# Patient Record
Sex: Male | Born: 1955 | Race: White | Hispanic: No | Marital: Married | Smoking: Former smoker
Health system: Southern US, Community
[De-identification: ages and names within clinical notes are randomized; demographics above are authoritative.]

## PROBLEM LIST (undated history)

## (undated) DIAGNOSIS — I10 Essential (primary) hypertension: Secondary | ICD-10-CM

## (undated) DIAGNOSIS — J45909 Unspecified asthma, uncomplicated: Secondary | ICD-10-CM

## (undated) DIAGNOSIS — U071 COVID-19: Secondary | ICD-10-CM

## (undated) HISTORY — PX: TOOTH EXTRACTION: SUR596

---

## 2011-08-22 ENCOUNTER — Encounter (HOSPITAL_BASED_OUTPATIENT_CLINIC_OR_DEPARTMENT_OTHER): Payer: Self-pay | Admitting: *Deleted

## 2011-08-22 ENCOUNTER — Emergency Department (HOSPITAL_BASED_OUTPATIENT_CLINIC_OR_DEPARTMENT_OTHER)
Admission: EM | Admit: 2011-08-22 | Discharge: 2011-08-22 | Disposition: A | Discharge status: Home / Self Care | Payer: 59 | Attending: Emergency Medicine | Admitting: Emergency Medicine

## 2011-08-22 DIAGNOSIS — I4510 Unspecified right bundle-branch block: Secondary | ICD-10-CM | POA: Insufficient documentation

## 2011-08-22 DIAGNOSIS — J45909 Unspecified asthma, uncomplicated: Secondary | ICD-10-CM | POA: Insufficient documentation

## 2011-08-22 DIAGNOSIS — I10 Essential (primary) hypertension: Secondary | ICD-10-CM | POA: Insufficient documentation

## 2011-08-22 DIAGNOSIS — H81399 Other peripheral vertigo, unspecified ear: Secondary | ICD-10-CM

## 2011-08-22 DIAGNOSIS — Z91038 Other insect allergy status: Secondary | ICD-10-CM | POA: Insufficient documentation

## 2011-08-22 HISTORY — DX: Essential (primary) hypertension: I10

## 2011-08-22 HISTORY — DX: Unspecified asthma, uncomplicated: J45.909

## 2011-08-22 MED ORDER — MECLIZINE HCL 25 MG PO TABS
25.0000 mg | ORAL_TABLET | Freq: Once | ORAL | Status: AC
  Administered 2011-08-22: 25 mg via ORAL
  Filled 2011-08-22: qty 1

## 2011-08-22 MED ORDER — MECLIZINE HCL 25 MG PO TABS: 25.0000 mg | ORAL_TABLET | Freq: Four times a day (QID) | ORAL | Status: AC | PRN

## 2011-08-22 MED ORDER — METOCLOPRAMIDE HCL 5 MG/ML IJ SOLN
10.0000 mg | Freq: Once | INTRAMUSCULAR | Status: AC
  Administered 2011-08-22: 10 mg via INTRAMUSCULAR
  Filled 2011-08-22: qty 2

## 2011-08-22 MED ORDER — DIAZEPAM 5 MG PO TABS: 5.0000 mg | ORAL_TABLET | Freq: Four times a day (QID) | ORAL | Status: AC | PRN

## 2011-08-22 NOTE — ED Notes (Signed)
Pt states he has had "vertigo-like" s/s x 1 week. N/V Thursday. None since. "Just feel weak."

## 2011-08-22 NOTE — ED Provider Notes (Signed)
History    Scribed for Hurman Horn, MD, the patient was seen in room MH07/MH07. This chart was scribed by Katha Cabal.   CSN: [id number]  Arrival date & time 08/22/11  1932   First MD Initiated Contact with Patient 08/22/11 2006      Chief Complaint  Patient presents with  . Dizziness    (Consider location/radiation/quality/duration/timing/severity/associated sxs/prior treatment) HPI Hurman Horn, MD entered patient's room at 8:07 PM   Leonard Hall is a 56 y.o. male who presents to the Emergency Department complaining of gradual worsening of dizziness with associated episodes of vomiting.  Patient reports having episode of nausea and vomiting about a week ago.  Positional vertigo.   Dizziness described as vertigo worse with movement and better while remaining still.  Denies visual disturbances, severe headache, cough, fever, bites, rashes, numbness, incoordination and confusion.  Symptoms are associated with generalized weakness. Patient with history of hypertension and is compliant with medications.  No recent flights or history of vertigo.  Patient has been driving in [place] in the mountains.  No change speech/vision/swallow/understanding. No lateralizing weakness or ataxia.       Past Medical History  Diagnosis Date  . Hypertension   . Asthma     Past Surgical History  Procedure Date  . Tooth extraction     History reviewed. No pertinent family history.  History  Substance Use Topics  . Smoking status: Never Smoker   . Smokeless tobacco: Not on file  . Alcohol Use: No      Review of Systems 10 Systems reviewed and all are negative for acute change except as noted in the HPI.   Allergies  Bee venom  Home Medications   Current Outpatient Rx  Name Route Sig Dispense Refill  . LISINOPRIL 20 MG PO TABS Oral Take 20 mg by mouth daily.    Marland Kitchen DIAZEPAM 5 MG PO TABS Oral Take 1 tablet (5 mg total) by mouth every 6 (six) hours as needed (vertigo).  10 tablet 0  . MECLIZINE HCL 25 MG PO TABS Oral Take 1 tablet (25 mg total) by mouth every 6 (six) hours as needed for dizziness. 20 tablet 0    BP 157/86  Pulse 68  Temp 98 F (36.7 C) (Oral)  Resp 20  Ht 5\' 10"  (1.778 m)  Wt 215 lb (97.523 kg)  BMI 30.85 kg/m2  SpO2 99%  Physical Exam  Nursing note and vitals reviewed. Constitutional:       Awake, alert, nontoxic appearance with baseline speech for patient.  HENT:  Head: Atraumatic.  Right Ear: Tympanic membrane normal.  Left Ear: Tympanic membrane normal.  Mouth/Throat: No oropharyngeal exudate.  Eyes: EOM are normal. Pupils are equal, round, and reactive to light. Right eye exhibits no discharge. Left eye exhibits no discharge.       lateral Nystagmus unidirectional fast component to the left with no disconjugate gaze   Neck: Neck supple.  Cardiovascular: Normal rate and regular rhythm.   No murmur heard. Pulmonary/Chest: Effort normal and breath sounds normal. No stridor. No respiratory distress. He has no wheezes. He has no rales. He exhibits no tenderness.  Abdominal: Soft. Bowel sounds are normal. He exhibits no mass. There is no tenderness. There is no rebound.  Musculoskeletal: He exhibits no tenderness.       Baseline ROM, moves extremities with no obvious new focal weakness.  Lymphadenopathy:    He has no cervical adenopathy.  Neurological:  Awake, alert, cooperative and aware of situation; motor strength 5/5 bilaterally; sensation normal to light touch bilaterally; peripheral visual fields full to confrontation; no facial asymmetry; tongue midline; major cranial nerves appear intact; no pronator drift, normal finger to nose bilaterally, baseline gait without new ataxia.  Skin: No rash noted.  Psychiatric: He has a normal mood and affect.    ED Course  Procedures (including critical care time) ECG: Normal sinus rhythm, ventricular rate 68, normal axis, incomplete right bundle branch block, no acute ischemic  changes noted, no comparison ECG available   DIAGNOSTIC STUDIES: Oxygen Saturation is 99% on room air, normal by my interpretation.     COORDINATION OF CARE: 8:17 PM  Physical exam complete.  Patient was drove to ED by his brother. 8:30 PM  Reglan and Antivert ordered.   9:51 PM  Patient informed of clinical course, understand medical decision-making process, and agree with plan.  Pt feels improved after observation and/or treatment in ED.  Patient is ambulatory without disturbances.        LABS / RADIOLOGY:   Labs Reviewed - No data to display No results found.       MDM  Doubt SAH, SBI, CVA.           MEDICATIONS GIVEN IN THE E.D. Scheduled Meds:    . meclizine  25 mg Oral Once  . metoCLOPramide (REGLAN) injection  10 mg Intramuscular Once   Continuous Infusions:      IMPRESSION: 1. Vertigo, peripheral      NEW MEDICATIONS: New Prescriptions   DIAZEPAM (VALIUM) 5 MG TABLET    Take 1 tablet (5 mg total) by mouth every 6 (six) hours as needed (vertigo).   MECLIZINE (ANTIVERT) 25 MG TABLET    Take 1 tablet (25 mg total) by mouth every 6 (six) hours as needed for dizziness.     I personally performed the services described in this documentation, which was scribed in my presence. The recorded information has been reviewed and considered.      Hurman Horn, MD 08/23/11 [phone]

## 2012-04-24 ENCOUNTER — Encounter (HOSPITAL_BASED_OUTPATIENT_CLINIC_OR_DEPARTMENT_OTHER): Payer: Self-pay

## 2012-04-24 ENCOUNTER — Inpatient Hospital Stay (HOSPITAL_BASED_OUTPATIENT_CLINIC_OR_DEPARTMENT_OTHER)
Admission: EM | Admit: 2012-04-24 | Discharge: 2012-04-26 | DRG: 190 | Disposition: A | Discharge status: Home / Self Care | Payer: 59 | Attending: Internal Medicine | Admitting: Internal Medicine

## 2012-04-24 ENCOUNTER — Emergency Department (HOSPITAL_BASED_OUTPATIENT_CLINIC_OR_DEPARTMENT_OTHER): Payer: 59

## 2012-04-24 DIAGNOSIS — J45902 Unspecified asthma with status asthmaticus: Secondary | ICD-10-CM

## 2012-04-24 DIAGNOSIS — Z87891 Personal history of nicotine dependence: Secondary | ICD-10-CM

## 2012-04-24 DIAGNOSIS — J189 Pneumonia, unspecified organism: Secondary | ICD-10-CM | POA: Diagnosis present

## 2012-04-24 DIAGNOSIS — J45901 Unspecified asthma with (acute) exacerbation: Secondary | ICD-10-CM | POA: Diagnosis present

## 2012-04-24 DIAGNOSIS — J441 Chronic obstructive pulmonary disease with (acute) exacerbation: Principal | ICD-10-CM | POA: Diagnosis present

## 2012-04-24 DIAGNOSIS — C44319 Basal cell carcinoma of skin of other parts of face: Secondary | ICD-10-CM | POA: Diagnosis present

## 2012-04-24 DIAGNOSIS — J9600 Acute respiratory failure, unspecified whether with hypoxia or hypercapnia: Secondary | ICD-10-CM

## 2012-04-24 DIAGNOSIS — Z883 Allergy status to other anti-infective agents status: Secondary | ICD-10-CM

## 2012-04-24 DIAGNOSIS — Z881 Allergy status to other antibiotic agents status: Secondary | ICD-10-CM

## 2012-04-24 DIAGNOSIS — Z79899 Other long term (current) drug therapy: Secondary | ICD-10-CM

## 2012-04-24 DIAGNOSIS — J9601 Acute respiratory failure with hypoxia: Secondary | ICD-10-CM | POA: Diagnosis present

## 2012-04-24 DIAGNOSIS — Z91038 Other insect allergy status: Secondary | ICD-10-CM

## 2012-04-24 DIAGNOSIS — I10 Essential (primary) hypertension: Secondary | ICD-10-CM | POA: Diagnosis present

## 2012-04-24 DIAGNOSIS — Z91018 Allergy to other foods: Secondary | ICD-10-CM

## 2012-04-24 LAB — CBC
HCT: 40.6 % (ref 39.0–52.0)
Hemoglobin: 14.9 g/dL (ref 13.0–17.0)
MCH: 31.5 pg (ref 26.0–34.0)
MCHC: 36.7 g/dL — ABNORMAL HIGH (ref 30.0–36.0)
MCV: 85.8 fL (ref 78.0–100.0)
Platelets: 227 10*3/uL (ref 150–400)
RBC: 4.73 MIL/uL (ref 4.22–5.81)
RDW: 12.1 % (ref 11.5–15.5)
WBC: 8.3 10*3/uL (ref 4.0–10.5)

## 2012-04-24 LAB — COMPREHENSIVE METABOLIC PANEL
ALT: 13 U/L (ref 0–53)
AST: 18 U/L (ref 0–37)
Albumin: 4.2 g/dL (ref 3.5–5.2)
Alkaline Phosphatase: 45 U/L (ref 39–117)
Calcium: 10.3 mg/dL (ref 8.4–10.5)
Potassium: 5.4 mEq/L — ABNORMAL HIGH (ref 3.5–5.1)
Sodium: 142 mEq/L (ref 135–145)
Total Protein: 7 g/dL (ref 6.0–8.3)

## 2012-04-24 LAB — CREATININE, SERUM
Creatinine, Ser: 0.88 mg/dL (ref 0.50–1.35)
GFR calc Af Amer: >90 mL/min
GFR calc non Af Amer: >90 mL/min

## 2012-04-24 LAB — CBC WITH DIFFERENTIAL/PLATELET
Basophils Absolute: 0 10*3/uL (ref 0.0–0.1)
Basophils Relative: 0 % (ref 0–1)
Eosinophils Absolute: 0.2 10*3/uL (ref 0.0–0.7)
Eosinophils Relative: 4 % (ref 0–5)
HCT: 44.7 % (ref 39.0–52.0)
Hemoglobin: 16.1 g/dL (ref 13.0–17.0)
Lymphocytes Relative: 25 % (ref 12–46)
MCV: 89.4 fL (ref 78.0–100.0)
Neutro Abs: 3.6 10*3/uL (ref 1.7–7.7)
WBC: 6.3 10*3/uL (ref 4.0–10.5)

## 2012-04-24 MED ORDER — ALBUTEROL SULFATE (5 MG/ML) 0.5% IN NEBU
INHALATION_SOLUTION | RESPIRATORY_TRACT | Status: AC
  Administered 2012-04-24: 15 mg/h
  Filled 2012-04-24: qty 0.5

## 2012-04-24 MED ORDER — LISINOPRIL 20 MG PO TABS
20.0000 mg | ORAL_TABLET | Freq: Every day | ORAL | Status: DC
Start: 2012-04-25 — End: 2012-04-26
  Administered 2012-04-25 – 2012-04-26 (×2): 20 mg via ORAL
  Filled 2012-04-24 (×2): qty 1

## 2012-04-24 MED ORDER — SODIUM CHLORIDE 0.9 % IJ SOLN: 3.0000 mL | INTRAMUSCULAR | Status: DC | PRN

## 2012-04-24 MED ORDER — AZITHROMYCIN 500 MG IV SOLR
500.0000 mg | INTRAVENOUS | Status: DC
  Administered 2012-04-25: 500 mg via INTRAVENOUS
  Filled 2012-04-24 (×2): qty 500

## 2012-04-24 MED ORDER — PNEUMOCOCCAL VAC POLYVALENT 25 MCG/0.5ML IJ INJ
0.5000 mL | INJECTION | INTRAMUSCULAR | Status: DC
  Filled 2012-04-24: qty 0.5

## 2012-04-24 MED ORDER — ENOXAPARIN SODIUM 40 MG/0.4ML ~~LOC~~ SOLN
40.0000 mg | SUBCUTANEOUS | Status: DC
  Administered 2012-04-24 – 2012-04-25 (×2): 40 mg via SUBCUTANEOUS
  Filled 2012-04-24 (×3): qty 0.4

## 2012-04-24 MED ORDER — DIPHENHYDRAMINE HCL 50 MG/ML IJ SOLN
50.0000 mg | Freq: Once | INTRAMUSCULAR | Status: AC
  Administered 2012-04-24: 50 mg via INTRAVENOUS

## 2012-04-24 MED ORDER — SODIUM CHLORIDE 0.9 % IJ SOLN
3.0000 mL | Freq: Two times a day (BID) | INTRAMUSCULAR | Status: DC
  Administered 2012-04-25 (×2): 3 mL via INTRAVENOUS

## 2012-04-24 MED ORDER — LEVOFLOXACIN IN D5W 750 MG/150ML IV SOLN
750.0000 mg | Freq: Once | INTRAVENOUS | Status: DC
  Filled 2012-04-24: qty 150

## 2012-04-24 MED ORDER — ALBUTEROL SULFATE (5 MG/ML) 0.5% IN NEBU
5.0000 mg | INHALATION_SOLUTION | Freq: Once | RESPIRATORY_TRACT | Status: AC
  Administered 2012-04-24: 5 mg via RESPIRATORY_TRACT

## 2012-04-24 MED ORDER — IPRATROPIUM BROMIDE 0.02 % IN SOLN
RESPIRATORY_TRACT | Status: AC
  Administered 2012-04-24: 0.5 mg via RESPIRATORY_TRACT
  Filled 2012-04-24: qty 2.5

## 2012-04-24 MED ORDER — CEFTRIAXONE SODIUM 1 G IJ SOLR
1.0000 g | INTRAMUSCULAR | Status: DC
  Administered 2012-04-24: 1 g via INTRAVENOUS
  Filled 2012-04-24 (×2): qty 10

## 2012-04-24 MED ORDER — ALBUTEROL SULFATE (5 MG/ML) 0.5% IN NEBU: 2.5000 mg | INHALATION_SOLUTION | RESPIRATORY_TRACT | Status: DC | PRN

## 2012-04-24 MED ORDER — MAGNESIUM SULFATE 50 % IJ SOLN
1.0000 g | Freq: Once | INTRAMUSCULAR | Status: AC
  Administered 2012-04-24: 1 g via INTRAVENOUS
  Filled 2012-04-24: qty 2

## 2012-04-24 MED ORDER — DIPHENHYDRAMINE HCL 50 MG/ML IJ SOLN
INTRAMUSCULAR | Status: AC
  Administered 2012-04-24: 50 mg via INTRAVENOUS
  Filled 2012-04-24: qty 1

## 2012-04-24 MED ORDER — IPRATROPIUM BROMIDE 0.02 % IN SOLN
0.5000 mg | Freq: Once | RESPIRATORY_TRACT | Status: AC
  Administered 2012-04-24: 0.5 mg via RESPIRATORY_TRACT

## 2012-04-24 MED ORDER — PREDNISONE 20 MG PO TABS
20.0000 mg | ORAL_TABLET | Freq: Two times a day (BID) | ORAL | Status: DC
  Administered 2012-04-24 – 2012-04-25 (×2): 20 mg via ORAL
  Filled 2012-04-24 (×5): qty 1

## 2012-04-24 MED ORDER — ALBUTEROL SULFATE (5 MG/ML) 0.5% IN NEBU
INHALATION_SOLUTION | RESPIRATORY_TRACT | Status: AC
  Administered 2012-04-24: 5 mg via RESPIRATORY_TRACT
  Filled 2012-04-24: qty 1

## 2012-04-24 MED ORDER — SODIUM CHLORIDE 0.45 % IV SOLN: INTRAVENOUS | Status: DC

## 2012-04-24 MED ORDER — SODIUM CHLORIDE 0.9 % IV SOLN: 250.0000 mL | INTRAVENOUS | Status: DC | PRN

## 2012-04-24 MED ORDER — PREDNISONE 50 MG PO TABS
60.0000 mg | ORAL_TABLET | Freq: Once | ORAL | Status: AC
  Administered 2012-04-24: 60 mg via ORAL
  Filled 2012-04-24: qty 1

## 2012-04-24 MED ORDER — ALBUTEROL SULFATE (5 MG/ML) 0.5% IN NEBU
5.0000 mg | INHALATION_SOLUTION | RESPIRATORY_TRACT | Status: DC
  Administered 2012-04-24: 5 mg via RESPIRATORY_TRACT
  Filled 2012-04-24: qty 1

## 2012-04-24 MED ORDER — LEVOFLOXACIN IN D5W 750 MG/150ML IV SOLN
750.0000 mg | Freq: Once | INTRAVENOUS | Status: AC
  Administered 2012-04-24: 750 mg via INTRAVENOUS
  Filled 2012-04-24: qty 150

## 2012-04-24 NOTE — ED Provider Notes (Addendum)
History     CSN: [id number]  Arrival date & time 04/24/12  1348   First MD Initiated Contact with Patient 04/24/12 1425      Chief Complaint  Patient presents with  . Asthma    (Consider location/radiation/quality/duration/timing/severity/associated sxs/prior treatment) Patient is a 57 y.o. male presenting with asthma. The history is provided by the patient.  Asthma This is a recurrent problem. The current episode started more than 1 week ago. The problem occurs constantly. The problem has been gradually worsening. Associated symptoms include shortness of breath. Pertinent negatives include no chest pain and no abdominal pain. The symptoms are aggravated by exertion and coughing. The symptoms are relieved by relaxation (albuterol but out of).   Patient with history of asthma,copd with uri symptoms began 1.5 weeks ago states began coughing about one week ago with brownish sputum.  Patient presents due to increasing dyspnea.  Patient had nebulizer here prior to my evaluation.  Patient has had albuterol inhaler and when wheezing or sick uses up to multiple times a day.  Patient has been out of inhaler 2-3 days.  Patient has been on multiple other meds and tried advair and symbicort but did not tolerate in past.  He has been eating and drinking without difficulty.  Patient called ems last night but refused transport after treatment.  Patient took wife's augmentin 8-10 over a week.  Past Medical History  Diagnosis Date  . Hypertension   . Asthma     Past Surgical History  Procedure Laterality Date  . Tooth extraction      History reviewed. No pertinent family history.  History  Substance Use Topics  . Smoking status: Never Smoker   . Smokeless tobacco: Not on file  . 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    Review of Systems  Constitutional: Negative for fever, activity change and appetite change.  HENT: Positive for rhinorrhea. Negative  for sinus pressure.   Eyes: Negative.   Respiratory: Positive for cough, chest tightness and shortness of breath.   Cardiovascular: Negative for chest pain and leg swelling.  Gastrointestinal: Negative.  Negative for abdominal pain.  Endocrine: Negative.   Genitourinary: Negative.   Musculoskeletal: Negative.   Skin: Negative.   Allergic/Immunologic: Negative.   Hematological: Negative.   Psychiatric/Behavioral: Negative.     Allergies  Bee venom and Cashew nut oil  Home Medications   Current Outpatient Rx  Name  Route  Sig  Dispense  Refill  . albuterol (PROVENTIL HFA;VENTOLIN HFA) 108 (90 BASE) MCG/ACT inhaler   Inhalation   Inhale 2 puffs into the lungs every 4 (four) hours as needed for wheezing.         Marland Kitchen albuterol (PROVENTIL) (5 MG/ML) 0.5% nebulizer solution   Nebulization   Take 5 mg by nebulization every 6 (six) hours as needed for wheezing.         Marland Kitchen lisinopril (PRINIVIL,ZESTRIL) 20 MG tablet   Oral   Take 20 mg by mouth daily.           BP 146/83  Pulse 75  Temp(Src) 97.7 F (36.5 C) (Oral)  Resp 30  Ht 5\' 10"  (1.778 m)  Wt 220 lb (99.791 kg)  BMI 31.57 kg/m2  SpO2 100%  Physical Exam  Nursing note and vitals reviewed. Constitutional: He is oriented to person, place, and time. He appears well-developed and well-nourished.  HENT:  Head: Normocephalic and atraumatic.  Right Ear: External ear normal.  Left Ear: External ear normal.  Nose: Nose normal.  Mouth/Throat: Oropharynx is clear and moist.  Eyes: Conjunctivae and EOM are normal. Pupils are equal, round, and reactive to light.  Neck: Normal range of motion. Neck supple.  Cardiovascular: Normal rate, regular rhythm, normal heart sounds and intact distal pulses.   Pulmonary/Chest: No respiratory distress. He has wheezes. He exhibits no tenderness.  Breath sounds decreased with tight wheezing  Abdominal: Soft. Bowel sounds are normal. He exhibits no distension and no mass. There is no  tenderness. There is no guarding.  Musculoskeletal: Normal range of motion.  Neurological: He is alert and oriented to person, place, and time. He has normal reflexes. He exhibits normal muscle tone. Coordination normal.  Skin: Skin is warm and dry.  Psychiatric: He has a normal mood and affect. His behavior is normal. Judgment and thought content normal.    ED Course  Procedures (including critical care time)  Labs Reviewed  CBC WITH DIFFERENTIAL - Abnormal; Notable for the following:    Monocytes Relative 14 (*)    All other components within normal limits  COMPREHENSIVE METABOLIC PANEL - Abnormal; Notable for the following:    Potassium 5.4 (*)    Glucose, Bld 124 (*)    GFR calc non Af Amer 82 (*)    All other components within normal limits   No results found.  Date: 04/24/2012  Rate: 68  Rhythm: normal sinus rhythm  QRS Axis: normal  Intervals: normal  ST/T Wave abnormalities: normal  Conduction Disutrbances: none  Narrative Interpretation: unremarkable  Dg Chest 2 View  04/24/2012  *RADIOLOGY REPORT*  Clinical Data:  Asthma, short of breath  CHEST - 2 VIEW  Comparison: None.  Findings: Patchy, somewhat angular opacity in the lingula.  The lungs are slightly hyperinflated and there is central airway thickening/peribronchial cuffing.  The cardiac and mediastinal contours within normal limits.  No focal airspace consolidation, pleural effusion or pneumothorax.  No acute osseous abnormality.  IMPRESSION:  Angular opacity in the lingula favored to reflect atelectasis. Early infiltrate/pneumonia difficult to exclude in the appropriate clinical setting.  Pulmonary hyperexpansion and central airway thickening consistent with clinical history of asthma.   Original Report Authenticated By: Malachy Moan, M.D.       No diagnosis found. CRITICAL CARE Performed by: Hilario Quarry   Total critical care time: 45  Critical care time was exclusive of separately billable procedures  and treating other patients.  Critical care was necessary to treat or prevent imminent or life-threatening deterioration.  Critical care was time spent personally by me on the following activities: development of treatment plan with patient and/or surrogate as well as nursing, discussions with consultants, evaluation of patient's response to treatment, examination of patient, obtaining history from patient or surrogate, ordering and performing treatments and interventions, ordering and review of laboratory studies, ordering and review of radiographic studies, pulse oximetry and re-evaluation of patient's condition.  MDM  Patient given nebulizer, prednisone 60 mg, followed by hour long neb, prednisone 60 mg, and magnesium 2 grams iv.  Patient reevaluated multiple times and continues to have expiratory wheezes although improved air flow.  Patient continues to require oxygen via [place] and is tachypneic although awake and alert and speaking on phrases.  Patient with partially treated vs untreated vs resolving vs failed cap after self treating with augmentin 875 for 8-10 doses over a week of borrowed medication.  Plan transfer to [facility] via [facility].  Patient discussed with Dr. Benjamine Mola and will be sent  to step down bed.          Hilario Quarry, MD 04/24/12 1734  Hilario Quarry, MD 04/24/12 1743

## 2012-04-24 NOTE — H&P (Signed)
Chief Complaint:  sob  HPI: 57 yo male h/o htn and asthma since childhood with over one week of sob, wheezing.  Ran out of his alb at home.  Did take several days of his wifes augmentin about a week ago.  No fevers at home.  Pos cough nonprod.  Nonsmoker.  No n/v/d.  Went to [facility] transferred here for further eval, was tachypneic and wheezing with some hypoxia there, this is majorly improved with tx of alb, and predisone and levaquin there.  Pt developed a rash after levaquin given at [facility] center.  His resp status is back to his baseline now.  No cp.  No le edema or swelling.  Review of Systems:  Positive and negative as per HPI otherwise all other systems are negative  Past Medical History: Past Medical History  Diagnosis Date  . Hypertension   . Asthma    Past Surgical History  Procedure Laterality Date  . Tooth extraction      Medications: Prior to Admission medications   Medication Sig Start Date End Date Taking? Authorizing Provider  albuterol (PROVENTIL HFA;VENTOLIN HFA) 108 (90 BASE) MCG/ACT inhaler Inhale 2 puffs into the lungs every 4 (four) hours as needed for wheezing.   Yes Historical Provider, MD  albuterol (PROVENTIL) (5 MG/ML) 0.5% nebulizer solution Take 5 mg by nebulization every 6 (six) hours as needed for wheezing.   Yes Historical Provider, MD  lisinopril (PRINIVIL,ZESTRIL) 20 MG tablet Take 20 mg by mouth daily.   Yes Historical Provider, MD    Allergies:   Allergies  Allergen Reactions  . Bee Venom Anaphylaxis  . Cashew Nut Oil   . Levaquin (Levofloxacin In D5w) Rash    Social History:  reports that he has quit smoking. He does not have any smokeless tobacco history on file. He reports that he does not drink alcohol or use illicit drugs.  Family History: History reviewed. No pertinent family history.  Physical Exam: Filed Vitals:   04/24/12 1730 04/24/12 1755 04/24/12 1802 04/24/12 2103  BP:   133/73 141/79  Pulse: 71  71 69  Temp:    98.3 F  (36.8 C)  TempSrc:    Oral  Resp: 16  21 12   Height:    5\' 10"  (1.778 m)  Weight:    101.7 kg (224 lb 3.3 oz)  SpO2: 96% 100% 100% 97%   General appearance: alert, cooperative, appears stated age and no distress Head: Normocephalic, without obvious abnormality, atraumatic Eyes: negative Nose: Nares normal. Septum midline. Mucosa normal. No drainage or sinus tenderness. Neck: no JVD and supple, symmetrical, trachea midline Lungs: clear to auscultation bilaterally Heart: regular rate and rhythm, S1, S2 normal, no murmur, click, rub or gallop Abdomen: soft, non-tender; bowel sounds normal; no masses,  no organomegaly Extremities: extremities normal, atraumatic, no cyanosis or edema Pulses: 2+ and symmetric Skin: Skin color, texture, turgor normal. No rashes or lesions Neurologic: Grossly normal    Labs on Admission:   Recent Labs  04/24/12 1410 04/24/12 1615  NA 142  --   K 5.4* 3.5  CL 106  --   CO2 25  --   GLUCOSE 124*  --   BUN 15  --   CREATININE 1.00  --   CALCIUM 10.3  --     Recent Labs  04/24/12 1410  AST 18  ALT 13  ALKPHOS 45  BILITOT 0.8  PROT 7.0  ALBUMIN 4.2    Recent Labs  04/24/12 1410  WBC 6.3  NEUTROABS 3.6  HGB 16.1  HCT 44.7  MCV 89.4  PLT 249    Radiological Exams on Admission: Dg Chest 2 View  04/24/2012  *RADIOLOGY REPORT*  Clinical Data:  Asthma, short of breath  CHEST - 2 VIEW  Comparison: None.  Findings: Patchy, somewhat angular opacity in the lingula.  The lungs are slightly hyperinflated and there is central airway thickening/peribronchial cuffing.  The cardiac and mediastinal contours within normal limits.  No focal airspace consolidation, pleural effusion or pneumothorax.  No acute osseous abnormality.  IMPRESSION:  Angular opacity in the lingula favored to reflect atelectasis. Early infiltrate/pneumonia difficult to exclude in the appropriate clinical setting.  Pulmonary hyperexpansion and central airway thickening consistent  with clinical history of asthma.   Original Report Authenticated By: Malachy Moan, M.D.     Assessment/Plan  57 yo male with acute asthma exac with CAP  Principal Problem:   Asthma with acute exacerbation Active Problems:   PNA (pneumonia)   HTN (hypertension)   Acute respiratory failure with hypoxia  Place on iv rocephin and azithro.  Po prednisone.  Oxygen prn.  freq nebs.  Monitor in stepdown.  Full code.    Dorell Gatlin A 04/24/2012, 9:23 PM

## 2012-04-24 NOTE — Progress Notes (Signed)
Pt. Admitted to room 3309 from [facility]; vss; patient has no complaints of SOB at this time; lungs dim. Throughout.  Callight within reach; pt. Oriented to room; will continue to monitor.  Pt. Had rash develop at [facility] due to the administration of Levaquin; allergy added to chart.   Vivi Martens RN

## 2012-04-24 NOTE — ED Notes (Signed)
Wife states that pt has been sick all weekend and called EMS and was given breathing tx last night, improved, elected to have no futher eval/treatment.  Pt with acute exacerbation today, Purfuse wheezing, diminished in bases.

## 2012-04-24 NOTE — ED Notes (Signed)
MD at bedside. 

## 2012-04-24 NOTE — Progress Notes (Signed)
Transfer from [facility] for asthma exacerbation.  Hx of smoking URI symptoms x 1 week -ran out of albuterol- took some of wife's augmentin ?PNA on x ray  No PCP  Given: neb, abx, steroids, O2- 2L Improved with above  SDU per ER doc  Marlin Canary DO

## 2012-04-24 NOTE — ED Notes (Signed)
After initial breathing treatment pt was placed on O2 at 2lpm, L upper lung sounds diminished, LLL and RUL, RML, RLL all inspiratory and expiratory wheezing remains

## 2012-04-25 LAB — CBC WITH DIFFERENTIAL/PLATELET
Basophils Relative: 0 % (ref 0–1)
Eosinophils Relative: 1 % (ref 0–5)
HCT: 40.2 % (ref 39.0–52.0)
Hemoglobin: 15 g/dL (ref 13.0–17.0)
MCHC: 37.3 g/dL — ABNORMAL HIGH (ref 30.0–36.0)
MCV: 87.2 fL (ref 78.0–100.0)
Monocytes Absolute: 0.5 10*3/uL (ref 0.1–1.0)
Monocytes Relative: 6 % (ref 3–12)
Neutro Abs: 8.3 10*3/uL — ABNORMAL HIGH (ref 1.7–7.7)

## 2012-04-25 LAB — BASIC METABOLIC PANEL
CO2: 24 mEq/L (ref 19–32)
Calcium: 9.5 mg/dL (ref 8.4–10.5)
Creatinine, Ser: 0.95 mg/dL (ref 0.50–1.35)
GFR calc Af Amer: >90 mL/min (ref >90)

## 2012-04-25 LAB — LEGIONELLA ANTIGEN, URINE: Legionella Antigen, Urine: NEGATIVE

## 2012-04-25 LAB — STREP PNEUMONIAE URINARY ANTIGEN: Strep Pneumo Urinary Antigen: NEGATIVE

## 2012-04-25 MED ORDER — AZITHROMYCIN 250 MG PO TABS
250.0000 mg | ORAL_TABLET | Freq: Every day | ORAL | Status: DC
  Administered 2012-04-25 – 2012-04-26 (×2): 250 mg via ORAL
  Filled 2012-04-25 (×2): qty 1

## 2012-04-25 MED ORDER — CEFUROXIME AXETIL 500 MG PO TABS
500.0000 mg | ORAL_TABLET | Freq: Two times a day (BID) | ORAL | Status: DC
  Administered 2012-04-25 – 2012-04-26 (×2): 500 mg via ORAL
  Filled 2012-04-25 (×4): qty 1

## 2012-04-25 MED ORDER — METHYLPREDNISOLONE SODIUM SUCC 125 MG IJ SOLR
60.0000 mg | Freq: Three times a day (TID) | INTRAMUSCULAR | Status: DC
  Administered 2012-04-25 – 2012-04-26 (×3): 60 mg via INTRAVENOUS
  Filled 2012-04-25 (×5): qty 0.96
  Filled 2012-04-25: qty 2
  Filled 2012-04-25: qty 0.96

## 2012-04-25 MED ORDER — LORATADINE 10 MG PO TABS
10.0000 mg | ORAL_TABLET | Freq: Every day | ORAL | Status: DC
  Administered 2012-04-25 – 2012-04-26 (×2): 10 mg via ORAL
  Filled 2012-04-25 (×2): qty 1

## 2012-04-25 MED ORDER — ALBUTEROL SULFATE (5 MG/ML) 0.5% IN NEBU
2.5000 mg | INHALATION_SOLUTION | Freq: Four times a day (QID) | RESPIRATORY_TRACT | Status: DC
  Administered 2012-04-25 – 2012-04-26 (×3): 2.5 mg via RESPIRATORY_TRACT
  Filled 2012-04-25 (×3): qty 0.5

## 2012-04-25 MED ORDER — PNEUMOCOCCAL VAC POLYVALENT 25 MCG/0.5ML IJ INJ
0.5000 mL | INJECTION | INTRAMUSCULAR | Status: AC | PRN
  Administered 2012-04-26: 0.5 mL via INTRAMUSCULAR
  Filled 2012-04-25: qty 0.5

## 2012-04-25 NOTE — Progress Notes (Signed)
Report called to Misty Stanley, receiving RN on 5500.  Pt transferred to 5527 via wheelchair with all belongings. Pt's wife accompanied pt to new room.   Roselie Awkward, RN

## 2012-04-25 NOTE — Progress Notes (Signed)
Utilization review completed.  

## 2012-04-25 NOTE — Progress Notes (Signed)
[facility] Progress Note [place] TEAM 1 - Stepdown/ICU TEAM   DRAYCE TAWIL ZOX:[id number] DOB: 1955-09-04 DOA: 04/24/2012 PCP: No primary provider on file.  Brief narrative: 57 yo male h/o htn and asthma since childhood with over one week of sob, wheezing. Ran out of his alb at home. Did take several days of his wifes augmentin about a week ago. No fevers at home. Pos cough nonprod. Nonsmoker. No n/v/d. Went to [facility] transferred here for further eval, was tachypneic and wheezing with some hypoxia there, this is majorly improved with tx of alb, and predisone and levaquin there. Pt developed a rash after levaquin given at [facility] center. His resp status is back to his baseline now. No cp. No le edema or swelling.  Assessment/Plan:  Asthma with acute exacerbation The patient is improving overall but has suffered somewhat of a setback this afternoon - I. feel it would be unwise to discharge him at this time - we will continue aggressive measures - we will initiate peak flow monitoring - he would benefit from scheduled inhaled steroids once his acute exacerbation has resolved and he is weaned from systemic steroids - I. 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 cell carcinoma right cheek I have discussed the appearance of the patient's mole with he and his wife and his daughter - I have advised that excision would be most appropriate in my opinion - I have noted 2 dermatologist and 2 plastic surgeons in the [place] area - the patient and his wife voiced understanding and the  wife reports that she will schedule a followup as soon as possible with the physician of their choice   Code Status: Full Family Communication: Spoke with patient wife and daughter at bedside Disposition Plan: Transfer to telemetry bed  Consultants: None  Procedures: None  Antibiotics: Rocephin 4/20 Azithromycin 4/20  DVT prophylaxis: Lovenox  HPI/Subjective: The patient was feeling much better early this morning.  As the day has progressed he has begun to experience recurrence of his wheezing.  He denies chest pain nausea vomiting fevers or chills.  Objective: Blood pressure 122/81, pulse 67, temperature 97.9 F (36.6 C), temperature source Oral, resp. rate 11, height 5\' 10"  (1.778 m), weight 101.7 kg (224 lb 3.3 oz), SpO2 94.00%.  Intake/Output Summary (Last 24 hours) at 04/25/12 1429 Last data filed at 04/25/12 0600  Gross per 24 hour  Intake    300 ml  Output   1275 ml  Net   -975 ml   Exam: General: No acute respiratory distress Lungs: Diffuse end expiratory wheeze with no focal crackles with good air movement throughout otherwise Cardiovascular: Regular rate and rhythm without murmur gallop or rub normal S1 and S2 Abdomen: Nontender, nondistended, soft, bowel sounds positive, no rebound, no ascites, no appreciable mass Extremities: No significant cyanosis, clubbing, or edema bilateral lower extremities Cutaneous:  The patient has an approximate 4 mm diameter irregular mole of the right cheek with a very small central cavitation that appears possibly consistent with a basal cell carcinoma  Data Reviewed: Basic Metabolic Panel:  Recent Labs Lab 04/24/12 1410 04/24/12  1615 04/24/12 2138 04/25/12 0435  NA 142  --   --  135  K 5.4* 3.5  --  5.3*  CL 106  --   --  101  CO2 25  --   --  24  GLUCOSE 124*  --   --  135*  BUN 15  --   --  15  CREATININE 1.00  --  0.88 0.95  CALCIUM 10.3  --   --  9.5   Liver Function Tests:  Recent Labs Lab 04/24/12 1410   AST 18  ALT 13  ALKPHOS 45  BILITOT 0.8  PROT 7.0  ALBUMIN 4.2   CBC:  Recent Labs Lab 04/24/12 1410 04/24/12 2138 04/25/12 0435  WBC 6.3 8.3 9.4  NEUTROABS 3.6  --  8.3*  HGB 16.1 14.9 15.0  HCT 44.7 40.6 40.2  MCV 89.4 85.8 87.2  PLT 249 227 235    Recent Results (from the past 240 hour(s))  MRSA PCR SCREENING     Status: None   Collection Time    04/24/12 10:04 PM      Result Value Range Status   MRSA by PCR NEGATIVE  NEGATIVE Final   Comment:            The GeneXpert MRSA Assay (FDA     approved for NASAL specimens     only), is one component of a     comprehensive MRSA colonization     surveillance program. It is not     intended to diagnose MRSA     infection nor to guide or     monitor treatment for     MRSA infections.     Studies:  Recent x-ray studies have been reviewed in detail by the Attending Physician  Scheduled Meds:  Scheduled Meds: . azithromycin  500 mg Intravenous Q24H  . cefTRIAXone (ROCEPHIN)  IV  1 g Intravenous Q24H  . enoxaparin (LOVENOX) injection  40 mg Subcutaneous Q24H  . lisinopril  20 mg Oral Daily  . predniSONE  20 mg Oral BID WC  . sodium chloride  3 mL Intravenous Q12H   Continuous Infusions:   Time spent on care of this patient:   [facility] T  [facility] Office  [phone] Pager - Text Page per Loretha Stapler as per below:  On-Call/Text Page:      [email or web address]      password [email or web address]  If 7PM-7AM, please contact night-coverage [email or web address] Password [email or web address] 04/25/2012, 2:29 PM   LOS: 1 day

## 2012-04-25 NOTE — Progress Notes (Signed)
NURSING PROGRESS NOTE  Leonard Hall [id number] Transfer Data: 04/25/2012 4:55 PM Attending Provider: Lonia Blood, MD PCP:No primary provider on file. Code Status: full  Leonard Hall is a 57 y.o. male patient transferred from 41 -No acute distress noted.  -No complaints of shortness of breath.  -No complaints of chest pain.   Cardiac Monitoring: Box # 5527 in place. Cardiac monitor yields:normal sinus rhythm.  Blood pressure 123/67, pulse 66, temperature 97.8 F (36.6 C), temperature source Oral, resp. rate 20, height 5\' 10"  (1.778 m), weight 101.7 kg (224 lb 3.3 oz), SpO2 91.00%.   IV Fluids:  IV in place, occlusive dsg intact without redness, IV cath antecubital left, condition patent and no redness.  Allergies:  Bee venom; Cashew nut oil; and Levaquin  Past Medical History:   has a past medical history of Hypertension and Asthma.  Past Surgical History:   has past surgical history that includes Tooth extraction.  Social History:   reports that he has quit smoking. He does not have any smokeless tobacco history on file. He reports that he does not drink alcohol or use illicit drugs.  Skin: warm dry intact  Patient/Family orientated to room. Information packet given to patient/family. Admission inpatient armband information verified with patient/family to include name and date of birth and placed on patient arm. Side rails up x 2, fall assessment and education completed with patient/family. Patient/family able to verbalize understanding of risk associated with falls and verbalized understanding to call for assistance before getting out of bed. Call light within reach. Patient/family able to voice and demonstrate understanding of unit orientation instructions.    Will continue to evaluate and treat per MD orders.

## 2012-04-26 DIAGNOSIS — J45901 Unspecified asthma with (acute) exacerbation: Secondary | ICD-10-CM

## 2012-04-26 LAB — BASIC METABOLIC PANEL
BUN: 16 mg/dL (ref 6–23)
Chloride: 101 mEq/L (ref 96–112)
GFR calc non Af Amer: >90 mL/min (ref >90)
Glucose, Bld: 151 mg/dL — ABNORMAL HIGH (ref 70–99)
Potassium: 4.6 mEq/L (ref 3.5–5.1)

## 2012-04-26 MED ORDER — CEFUROXIME AXETIL 500 MG PO TABS: 500.0000 mg | ORAL_TABLET | Freq: Two times a day (BID) | ORAL | Status: DC

## 2012-04-26 MED ORDER — MONTELUKAST SODIUM 10 MG PO TABS: 10.0000 mg | ORAL_TABLET | Freq: Every day | ORAL | Status: DC

## 2012-04-26 MED ORDER — ALBUTEROL SULFATE HFA 108 (90 BASE) MCG/ACT IN AERS: 2.0000 | INHALATION_SPRAY | RESPIRATORY_TRACT | Status: DC | PRN

## 2012-04-26 MED ORDER — AZITHROMYCIN 250 MG PO TABS: ORAL_TABLET | ORAL | Status: DC

## 2012-04-26 MED ORDER — PREDNISONE (PAK) 10 MG PO TABS: 50.0000 mg | ORAL_TABLET | Freq: Every day | ORAL | Status: DC

## 2012-04-26 MED ORDER — ALBUTEROL SULFATE (5 MG/ML) 0.5% IN NEBU: 5.0000 mg | INHALATION_SOLUTION | Freq: Four times a day (QID) | RESPIRATORY_TRACT | Status: DC | PRN

## 2012-04-26 NOTE — Progress Notes (Signed)
NURSING PROGRESS NOTE  Leonard Hall [id number] Discharge Data: 04/26/2012 12:05 PM Attending Provider: Clydia Llano, MD PCP:No primary provider on file.     Leonard Hall to be D/C'd Home per MD order.  Discussed with the patient the After Visit Summary and all questions fully answered. All IV's discontinued with no bleeding noted. All belongings returned to patient for patient to take home.   Last Vital Signs:  Blood pressure 106/63, pulse 57, temperature 98.1 F (36.7 C), temperature source Oral, resp. rate 20, height 5\' 10"  (1.778 m), weight 101.7 kg (224 lb 3.3 oz), SpO2 91.00%.  Discharge Medication List   Medication List    TAKE these medications       albuterol 108 (90 BASE) MCG/ACT inhaler  Commonly known as:  PROVENTIL HFA;VENTOLIN HFA  Inhale 2 puffs into the lungs every 4 (four) hours as needed for wheezing.     albuterol (5 MG/ML) 0.5% nebulizer solution  Commonly known as:  PROVENTIL  Take 1 mL (5 mg total) by nebulization every 6 (six) hours as needed for wheezing.     azithromycin 250 MG tablet  Commonly known as:  ZITHROMAX  Take one pill daily until gone  Start taking on:  04/27/2012     cefUROXime 500 MG tablet  Commonly known as:  CEFTIN  Take 1 tablet (500 mg total) by mouth 2 (two) times daily with a meal.     lisinopril 20 MG tablet  Commonly known as:  PRINIVIL,ZESTRIL  Take 20 mg by mouth daily.     montelukast 10 MG tablet  Commonly known as:  SINGULAIR  Take 1 tablet (10 mg total) by mouth at bedtime.     predniSONE 10 MG tablet  Commonly known as:  STERAPRED UNI-PAK  Take 5 tablets (50 mg total) by mouth daily. Take 5 pills with breakfast on 4/23, 4/24  Take 4 pills with breakfast on 4/25, 4/26  Take 3 pills with breakfast on 4/27, 428  Take 2 pills with breakfast on 4/29, 4/30  Take 1 pill with breakfast on 5/1 and 5/2.  Start taking on:  04/27/2012

## 2012-04-26 NOTE — Discharge Summary (Signed)
Physician Discharge Summary  Leonard Hall WJX:[id number] DOB: April 14, 1955 DOA: 04/24/2012  PCP: Vivien Presto, MD  Admit date: 04/24/2012 Discharge date: 04/26/2012  Time spent: 45 minutes  Recommendations for Outpatient Follow-up:  Establish care with a primary care physician for long term care of your asthma and medical maintenance issues.  Discharge Diagnoses:  Principal Problem:   Asthma with acute exacerbation Active Problems:   PNA (pneumonia)   HTN (hypertension)   Acute respiratory failure with hypoxia   Discharge Condition: stable, still with mild wheeze and SOB  Diet recommendation: Heart healthy  Filed Weights   04/24/12 1359 04/24/12 2103  Weight: 99.791 kg (220 lb) 101.7 kg (224 lb 3.3 oz)    History of present illness:  57 yo male h/o htn and asthma since childhood with over one week of sob, wheezing. Ran out of his albuterol at home. Did take several days of his wife's augmentin about a week ago. No fevers at home. cough nonprod. Nonsmoker. No n/v/d. Went to [facility] transferred here for further eval, was tachypneic and wheezing with some hypoxia there, this is majorly improved with tx of albuterol, predisone and levaquin. Pt developed a rash after levaquin given at [facility] center. His resp status is back to his baseline now. No cp. No le edema or swelling.   Hospital Course:   Acute asthma exacerbation causing acute respiratory failure with hypoxia. Leonard Hall was treated with Nebulizers, antibiotics and Steroids.  He improved over 48 hours and while he is still wheezing, he is stable enough to recover at home.  He will be discharged with prescriptions for albuterol nebs, albuterol inhaler, singulair, Ceftin and Azithromycin.    Community acquired Pneumonia CXR showed possible Left lingular PNA, but this may be atelectasis.  Prescribed ceftin and azithromycin for a total of 5 days of antibiotic treatment.  Hypertension Controlled on Lisinopril.  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  I have discussed the appearance of the patient's mole with he and his wife and his daughter - I have advised that excision would be most appropriate in my opinion - I have noted 2 dermatologist and 2 plastic surgeons in the [place] area - the patient and his wife voiced understanding and the wife reports that she will schedule a followup as soon as possible with the physician of their choice       Discharge Exam: Filed Vitals:   04/25/12 1958 04/25/12 2125 04/26/12 0501 04/26/12 0828  BP:  132/65 106/63   Pulse:  75 57   Temp:  98 F (36.7 C) 98.1 F (36.7 C)   TempSrc:  Oral Oral   Resp:  20 20   Height:      Weight:      SpO2: 91% 95% 93% 91%   General: wd, wn, male with no acute distress  Lungs: Diffuse end expiratory wheeze, no crackles or rales, no accessory muscle movement.   Cardiovascular: Regular rate and rhythm without murmur gallop or rub normal S1 and S2  Abdomen: Nontender, nondistended, soft, bowel sounds positive, no rebound, no ascites, no appreciable mass  Extremities: No significant cyanosis, clubbing, or edema bilateral lower extremities  Cutaneous: The patient has an approximate 4 mm diameter irregular mole of the right cheek with a very small central cavitation that appears possibly consistent with a basal cell carcinoma    Discharge Instructions      Discharge Orders   Future Orders Complete By Expires     Diet - low sodium heart  healthy  As directed     Increase activity slowly  As directed         Medication List    TAKE these medications       albuterol 108 (90 BASE) MCG/ACT inhaler  Commonly known as:  PROVENTIL HFA;VENTOLIN HFA  Inhale 2 puffs into the lungs every 4 (four) hours as needed for wheezing.     albuterol (5 MG/ML) 0.5% nebulizer solution  Commonly known as:  PROVENTIL  Take 1 mL (5 mg total) by nebulization every 6 (six) hours as needed for wheezing.     azithromycin 250 MG tablet  Commonly  known as:  ZITHROMAX  Take one pill daily until gone  Start taking on:  04/27/2012     cefUROXime 500 MG tablet  Commonly known as:  CEFTIN  Take 1 tablet (500 mg total) by mouth 2 (two) times daily with a meal.     lisinopril 20 MG tablet  Commonly known as:  PRINIVIL,ZESTRIL  Take 20 mg by mouth daily.     montelukast 10 MG tablet  Commonly known as:  SINGULAIR  Take 1 tablet (10 mg total) by mouth at bedtime.     predniSONE 10 MG tablet  Commonly known as:  STERAPRED UNI-PAK  Take 5 tablets (50 mg total) by mouth daily. Take 5 pills with breakfast on 4/23, 4/24  Take 4 pills with breakfast on 4/25, 4/26  Take 3 pills with breakfast on 4/27, 428  Take 2 pills with breakfast on 4/29, 4/30  Take 1 pill with breakfast on 5/1 and 5/2.  Start taking on:  04/27/2012       Follow-up Information   Please follow up. (Please follow up with a primary care physician regarding on-going care of your asthma)        The results of significant diagnostics from this hospitalization (including imaging, microbiology, ancillary and laboratory) are listed below for reference.    Significant Diagnostic Studies: Dg Chest 2 View  04/24/2012  *RADIOLOGY REPORT*  Clinical Data:  Asthma, short of breath  CHEST - 2 VIEW  Comparison: None.  Findings: Patchy, somewhat angular opacity in the lingula.  The lungs are slightly hyperinflated and there is central airway thickening/peribronchial cuffing.  The cardiac and mediastinal contours within normal limits.  No focal airspace consolidation, pleural effusion or pneumothorax.  No acute osseous abnormality.  IMPRESSION:  Angular opacity in the lingula favored to reflect atelectasis. Early infiltrate/pneumonia difficult to exclude in the appropriate clinical setting.  Pulmonary hyperexpansion and central airway thickening consistent with clinical history of asthma.   Original Report Authenticated By: Malachy Moan, M.D.     Microbiology: Recent Results  (from the past 240 hour(s))  CULTURE, BLOOD (ROUTINE X 2)     Status: None   Collection Time    04/24/12  9:45 PM      Result Value Range Status   Specimen Description BLOOD LEFT HAND   Final   Special Requests BOTTLES DRAWN AEROBIC AND ANAEROBIC 10CC EA   Final   Culture  Setup Time 04/25/2012 07:30   Final   Culture     Final   Value:        BLOOD CULTURE RECEIVED NO GROWTH TO DATE CULTURE WILL BE HELD FOR 5 DAYS BEFORE ISSUING A FINAL NEGATIVE REPORT   Report Status PENDING   Incomplete  CULTURE, BLOOD (ROUTINE X 2)     Status: None   Collection Time    04/24/12  9:52 PM  Result Value Range Status   Specimen Description BLOOD RIGHT HAND   Final   Special Requests BOTTLES DRAWN AEROBIC AND ANAEROBIC 10CC EA   Final   Culture  Setup Time 04/25/2012 07:30   Final   Culture     Final   Value:        BLOOD CULTURE RECEIVED NO GROWTH TO DATE CULTURE WILL BE HELD FOR 5 DAYS BEFORE ISSUING A FINAL NEGATIVE REPORT   Report Status PENDING   Incomplete  MRSA PCR SCREENING     Status: None   Collection Time    04/24/12 10:04 PM      Result Value Range Status   MRSA by PCR NEGATIVE  NEGATIVE Final   Comment:            The GeneXpert MRSA Assay (FDA     approved for NASAL specimens     only), is one component of a     comprehensive MRSA colonization     surveillance program. It is not     intended to diagnose MRSA     infection nor to guide or     monitor treatment for     MRSA infections.     Labs: Basic Metabolic Panel:  Recent Labs Lab 04/24/12 1410 04/24/12 1615 04/24/12 2138 04/25/12 0435 04/26/12 0600  NA 142  --   --  135 135  K 5.4* 3.5  --  5.3* 4.6  CL 106  --   --  101 101  CO2 25  --   --  24 23  GLUCOSE 124*  --   --  135* 151*  BUN 15  --   --  15 16  CREATININE 1.00  --  0.88 0.95 0.89  CALCIUM 10.3  --   --  9.5 9.7   Liver Function Tests:  Recent Labs Lab 04/24/12 1410  AST 18  ALT 13  ALKPHOS 45  BILITOT 0.8  PROT 7.0  ALBUMIN 4.2    CBC:  Recent Labs Lab 04/24/12 1410 04/24/12 2138 04/25/12 0435  WBC 6.3 8.3 9.4  NEUTROABS 3.6  --  8.3*  HGB 16.1 14.9 15.0  HCT 44.7 40.6 40.2  MCV 89.4 85.8 87.2  PLT 249 227 235    Signed:  Conley Canal  [facility] 04/26/2012, 1:44 PM

## 2012-04-26 NOTE — Discharge Summary (Signed)
Addendum  Patient seen and examined, chart and data base reviewed.  I agree with the above assessment and discharge plan.  For full details please see Mrs. Algis Downs PA note.  Acute severe asthma, flair result, he is on room air and minimal wheezing, home today.   Clint Lipps, MD [facility] Pager: [phone] 04/26/2012, 3:41 PM

## 2012-05-01 LAB — CULTURE, BLOOD (ROUTINE X 2): Culture: NO GROWTH

## 2012-08-29 ENCOUNTER — Institutional Professional Consult (permissible substitution): Payer: 59 | Admitting: Critical Care Medicine

## 2012-09-09 ENCOUNTER — Institutional Professional Consult (permissible substitution): Payer: 59 | Admitting: Critical Care Medicine

## 2012-09-09 ENCOUNTER — Ambulatory Visit (INDEPENDENT_AMBULATORY_CARE_PROVIDER_SITE_OTHER): Payer: 59 | Admitting: Internal Medicine

## 2012-09-09 ENCOUNTER — Encounter: Payer: Self-pay | Admitting: Internal Medicine

## 2012-09-09 VITALS — BP 134/80 | HR 73 | Temp 97.7°F | Ht 70.0 in | Wt 235.0 lb

## 2012-09-09 DIAGNOSIS — R05 Cough: Secondary | ICD-10-CM

## 2012-09-09 DIAGNOSIS — J45901 Unspecified asthma with (acute) exacerbation: Secondary | ICD-10-CM

## 2012-09-09 DIAGNOSIS — I10 Essential (primary) hypertension: Secondary | ICD-10-CM

## 2012-09-09 MED ORDER — OLMESARTAN MEDOXOMIL 20 MG PO TABS: ORAL_TABLET | ORAL | Status: DC

## 2012-09-09 MED ORDER — PREDNISONE (PAK) 10 MG PO TABS: ORAL_TABLET | ORAL | Status: DC

## 2012-09-09 NOTE — Progress Notes (Signed)
Subjective:    Patient ID: Leonard Hall, male    DOB: [birth year]   MRN: [id number]  HPI  71 yowm sickly as child dx as asthma took shots thru age 57 and improved didn't take daily meds flares sev times a year and not trouble with aerobic sports and then in 30's while smoking quit smoking around age 85 more freq flares and need for advair around 2009 and singulair and symbicort and much worse x 2011 while on lisinopril with most most recent admit to [facility] hospital April 2014 referred by Leonard Hall 09/09/2012 to pulmonary clinic for dtc asthma.      09/09/2012 1st [facility] office visit/ Leonard Hall on ACEi cc daily cough / congestion x 3 years some better since last round of prednisone but still feel need for saba neb up to twice daily, no purulent sputum but severe cough with mopstly mucoid sputum during flares which are more freq since admit as below  Admit date: 04/24/2012  Discharge date: 04/26/2012    Recommendations for Outpatient Follow-up:  Establish care with a primary care physician for long term care of your asthma and medical maintenance issues.  Discharge Diagnoses:  Principal Problem:  Asthma with acute exacerbation  Active Problems:  PNA (pneumonia)  HTN (hypertension)  Acute respiratory failure with hypoxia  Discharge Condition: stable, still with mild wheeze and SOB  Diet recommendation: Heart healthy  Filed Weights    04/24/12 1359  04/24/12 2103   Weight:  99.791 kg (220 lb)  101.7 kg (224 lb 3.3 oz)   History of present illness:  57 yo male h/o htn and asthma since childhood with over one week of sob, wheezing. Ran out of his albuterol at home. Did take several days of his wife's augmentin about a week ago. No fevers at home. cough nonprod. Nonsmoker. No n/v/d. Went to [facility] transferred here for further eval, was tachypneic and wheezing with some hypoxia there, this is majorly improved with tx of albuterol, predisone and levaquin. Pt developed a rash after levaquin given  at [facility] center. His resp status is back to his baseline now. No cp. No le edema or swelling.  Hospital Course:  Acute asthma exacerbation causing acute respiratory failure with hypoxia.  Leonard Hall was treated with Nebulizers, antibiotics and Steroids. He improved over 48 hours and while he is still wheezing, he is stable enough to recover at home. He will be discharged with prescriptions for albuterol nebs, albuterol inhaler, singulair, Ceftin and Azithromycin.  Community acquired Pneumonia  CXR showed possible Left lingular PNA, but this may be atelectasis. Prescribed ceftin and azithromycin for a total of 5 days of antibiotic treatment.  Hypertension  Controlled on Lisinopril.     Wife says wheezing loudly intermittently while sleeping   No obvious daytime variabilty or assoc ex or pleuritic cp  overt sinus or hb symptoms. No unusual exp hx or h/o childhood pna/ asthma or knowledge of premature birth.    Also denies any obvious fluctuation of symptoms with weather or environmental changes or other aggravating or alleviating factors except as outlined above.  Current Medications, Allergies, Past Medical History, Past Surgical History, Family History, and Social History were reviewed in [employer] record.       Review of Systems  Constitutional: Negative for fever, chills, activity change, appetite change and unexpected weight change.  HENT: Negative for congestion, sore throat, rhinorrhea, sneezing, trouble swallowing, dental problem, voice change and postnasal drip.   Eyes: Negative for  visual disturbance.  Respiratory: Positive for cough. Negative for choking and shortness of breath.   Cardiovascular: Negative for chest pain and leg swelling.  Gastrointestinal: Negative for nausea, vomiting and abdominal pain.  Genitourinary: Negative for difficulty urinating.  Musculoskeletal: Positive for arthralgias.  Skin: Negative for rash.  Psychiatric/Behavioral:  Negative for behavioral problems and confusion.       Objective:   Physical Exam  amb mod obese wm nad with classic prominent pseudowheeze heard across the room  Wt Readings from Last 3 Encounters:  09/09/12 235 lb (106.595 kg)  04/24/12 224 lb 3.3 oz (101.7 kg)  08/22/11 215 lb (97.523 kg)     HEENT: nl dentition, turbinates, and orophanx. Nl external ear canals without cough reflex   NECK :  without JVD/Nodes/TM/ nl carotid upstrokes bilaterally   LUNGS: no acc muscle use, clear to A and P bilaterally without cough on insp or exp maneuvers   CV:  RRR  no s3 or murmur or increase in P2, no edema   ABD:  soft and nontender with nl excursion in the supine position. No bruits or organomegaly, bowel sounds nl  MS:  warm without deformities, calf tenderness, cyanosis or clubbing  SKIN: warm and dry without lesions    NEURO:  alert, approp, no deficits    cxr 04/24/12  Angular opacity in the lingula favored to reflect atelectasis.  Early infiltrate/pneumonia difficult to exclude in the appropriate  clinical setting.  Pulmonary hyperexpansion and central airway thickening consistent  with clinical history of asthma.       Assessment & Plan:

## 2012-09-09 NOTE — Patient Instructions (Addendum)
Stop lisinopril - it may be fueling your problems and takes a few weeks to wear off Start benicar 20 mg one daily  Only nebulizer if you need it for breathing or cough mucinex dm is good for the cough   GERD (REFLUX)  is an extremely common cause of respiratory symptoms just like yours and combines with  Lisinopril to destablize your airway , many times with no significant heartburn at all.    It can be treated with medication, but also with lifestyle changes including avoidance of late meals, excessive alcohol, smoking cessation, and avoid fatty foods, chocolate, peppermint, colas, red wine, and acidic juices such as orange juice.  NO MINT OR MENTHOL PRODUCTS SO NO COUGH DROPS  USE SUGARLESS CANDY INSTEAD (jolley ranchers or Stover's)  NO OIL BASED VITAMINS - use powdered substitutes.  If not better: Prednisone 10 mg take  4 each am x 2 days,   2 each am x 2 days,  1 each am x 2 days and stop Pantoprazole (protonix) 40 mg   Take 30-60 min before first meal of the day and Pepcid 20 mg one bedtime until return to office  Please schedule a follow up office visit in 4 weeks, sooner if needed pfts / cxr

## 2012-09-10 DIAGNOSIS — R05 Cough: Secondary | ICD-10-CM | POA: Insufficient documentation

## 2012-09-10 NOTE — Assessment & Plan Note (Signed)
Classic Upper airway cough syndrome, so named because it's frequently impossible to sort out how much is  CR/sinusitis with freq throat clearing (which can be related to primary GERD)   vs  causing  secondary (" extra esophageal")  GERD from wide swings in gastric pressure that occur with throat clearing, often  promoting self use of mint and menthol lozenges that reduce the lower esophageal sphincter tone and exacerbate the problem further in a cyclical fashion.   These are the same pts (now being labeled as having "irritable larynx syndrome" by some cough centers) who not infrequently have a history of having failed to tolerate ace inhibitors (which is the obvious leading suspect here),  dry powder inhalers or biphosphonates or report having atypical reflux symptoms that don't respond to standard doses of PPI , and are easily confused as having aecopd or asthma flares by even experienced allergists/ pulmonologists.   For now needs trial off acei and just use the neb saba since best choice for wheezing that won't make his cough worse

## 2012-09-10 NOTE — Assessment & Plan Note (Addendum)
ACE inhibitors are problematic in  pts with airway complaints because  even experienced pulmonologists can't always distinguish ace effects from copd/asthma.  By themselves they don't actually cause a problem, much like oxygen can't by itself start a fire, but they certainly serve as a powerful catalyst or enhancer for any "fire"  or inflammatory process in the upper airway, be it caused by an ET  tube or more commonly reflux (especially in the obese as is the case here and progressively worse on freq prednisone rx which partially reverses the acei effects or pts with known GERD or who are on biphoshonates).    In the era of ARB near equivalency  And the refractory nature of his upper airway symptoms rec d/c acei indefinitely..  rx benicar 20 mg samples for now then regroup in 4 weeks

## 2012-09-10 NOTE — Assessment & Plan Note (Signed)
DDX of  difficult airways managment all start with A and  include Adherence, Ace Inhibitors, Acid Reflux, Active Sinus Disease, Alpha 1 Antitripsin deficiency, Anxiety masquerading as Airways dz,  ABPA,  allergy(esp in young), Aspiration (esp in elderly), Adverse effects of DPI,  Active smokers, plus two Bs  = Bronchiectasis and Beta blocker use..and one C= CHF   Not clear at all to me to what extent he really has asthma and if he does why it wasn't a problem while he smoked and much worse now than then, leading me to believe acei or acid reflux the leading suspects so try off acei first then return for pfts

## 2012-09-30 ENCOUNTER — Ambulatory Visit (INDEPENDENT_AMBULATORY_CARE_PROVIDER_SITE_OTHER): Payer: 59 | Admitting: Internal Medicine

## 2012-09-30 ENCOUNTER — Ambulatory Visit (INDEPENDENT_AMBULATORY_CARE_PROVIDER_SITE_OTHER)
Admission: RE | Admit: 2012-09-30 | Discharge: 2012-09-30 | Disposition: A | Discharge status: Home / Self Care | Payer: 59 | Source: Ambulatory Visit | Attending: Internal Medicine | Admitting: Internal Medicine

## 2012-09-30 ENCOUNTER — Encounter: Payer: Self-pay | Admitting: Internal Medicine

## 2012-09-30 VITALS — BP 140/84 | HR 92 | Temp 98.0°F | Ht 70.0 in | Wt 234.0 lb

## 2012-09-30 DIAGNOSIS — I10 Essential (primary) hypertension: Secondary | ICD-10-CM

## 2012-09-30 DIAGNOSIS — R05 Cough: Secondary | ICD-10-CM

## 2012-09-30 DIAGNOSIS — J45901 Unspecified asthma with (acute) exacerbation: Secondary | ICD-10-CM

## 2012-09-30 MED ORDER — PREDNISONE (PAK) 10 MG PO TABS: ORAL_TABLET | ORAL | Status: DC

## 2012-09-30 MED ORDER — LEVALBUTEROL HCL 0.63 MG/3ML IN NEBU
0.6300 mg | INHALATION_SOLUTION | Freq: Once | RESPIRATORY_TRACT | Status: AC
  Administered 2012-09-30: 0.63 mg via RESPIRATORY_TRACT

## 2012-09-30 MED ORDER — MOMETASONE FURO-FORMOTEROL FUM 200-5 MCG/ACT IN AERO: INHALATION_SPRAY | RESPIRATORY_TRACT | Status: DC

## 2012-09-30 MED ORDER — FAMOTIDINE 20 MG PO TABS: ORAL_TABLET | ORAL | Status: DC

## 2012-09-30 MED ORDER — PANTOPRAZOLE SODIUM 40 MG PO TBEC: 40.0000 mg | DELAYED_RELEASE_TABLET | Freq: Every day | ORAL | Status: DC

## 2012-09-30 NOTE — Progress Notes (Signed)
Subjective:    Patient ID: [facility], male    DOB: [birth year]   MRN: [id number]  Brief patient profile:  67 yowm sickly as child dx as asthma took shots thru age 57 and improved didn't take daily meds flares sev times a year and not trouble with aerobic sports and then in 30's while smoking quit smoking around age 42 more freq flares and need for advair around 2009 and singulair and symbicort and much worse x 2011 while on lisinopril with most most recent admit to [facility] hospital April 2014 referred by [facility] 09/09/2012 to pulmonary clinic for dtc asthma.     History of Present Illness  09/09/2012 1st [facility] office visit/ [facility] on ACEi cc daily cough / congestion x 3 years some better since last round of prednisone but still feel need for saba neb up to twice daily, no purulent sputum but severe cough with mopstly mucoid sputum during flares which are more freq since admit as below    Admit date: 04/24/2012  Discharge date: 04/26/2012    Recommendations for Outpatient Follow-up:  Establish care with a primary care physician for long term care of your asthma and medical maintenance issues.  Discharge Diagnoses:  Principal Problem:  Asthma with acute exacerbation  Active Problems:  PNA (pneumonia)  HTN (hypertension)  Acute respiratory failure with hypoxia  Discharge Condition: stable, still with mild wheeze and SOB  Diet recommendation: Heart healthy  Filed Weights    04/24/12 1359  04/24/12 2103   Weight:  99.791 kg (220 lb)  101.7 kg (224 lb 3.3 oz)   History of present illness:  57 yo male h/o htn and asthma since childhood with over one week of sob, wheezing. Ran out of his albuterol at home. Did take several days of his wife's augmentin about a week ago. No fevers at home. cough nonprod. Nonsmoker. No n/v/d. Went to [facility] transferred here for further eval, was tachypneic and wheezing with some hypoxia there, this is majorly improved with tx of albuterol, predisone and  levaquin. Pt developed a rash after levaquin given at [facility] center. His resp status is back to his baseline now. No cp. No le edema or swelling.  Hospital Course:  Acute asthma exacerbation causing acute respiratory failure with hypoxia.  Mr. [facility] was treated with Nebulizers, antibiotics and Steroids. He improved over 48 hours and while he is still wheezing, he is stable enough to recover at home. He will be discharged with prescriptions for albuterol nebs, albuterol inhaler, singulair, Ceftin and Azithromycin.  Community acquired Pneumonia  CXR showed possible Left lingular PNA, but this may be atelectasis. Prescribed ceftin and azithromycin for a total of 5 days of antibiotic treatment.  Hypertension  Controlled on Lisinopril.   Wife says wheezing loudly intermittently while sleeping  rec Stop lisinopril - it may be fueling your problems and takes a few weeks to wear off Start benicar 20 mg one daily  Only nebulizer if you need it for breathing or cough mucinex dm is good for the cough GERD  Diet  If not better: Prednisone 10 mg take  4 each am x 2 days,   2 each am x 2 days,  1 each am x 2 days and stop Pantoprazole (protonix) 40 mg   Take 30-60 min before first meal of the day and Pepcid 20 mg one bedtime until return to office    09/30/2012 f/u ov/[facility] re: refractory cough/ sob  Chief Complaint  Patient presents with  .  Acute Visit    SOB, wheezing and tightness in chest x1 week.  Also slight cough w/ yellowish mucus     Better on prednisone then worse off it - does not have saba hfa, totally reliant on neb   No obvious daytime variabilty or assoc ex or pleuritic cp  overt sinus or hb symptoms. No unusual exp hx or h/o childhood pna/ asthma or knowledge of premature birth.    Also denies any obvious fluctuation of symptoms with weather or environmental changes or other aggravating or alleviating factors except as outlined above.  Current Medications, Allergies, Past Medical  History, Past Surgical History, Family History, and Social History were reviewed in [employer] record.             Objective:   Physical Exam  amb mod obese wm nad with classic prominent pseudowheeze heard across the room   09/30/2012      234  Wt Readings from Last 3 Encounters:  09/09/12 235 lb (106.595 kg)  04/24/12 224 lb 3.3 oz (101.7 kg)  08/22/11 215 lb (97.523 kg)     HEENT: nl dentition, turbinates, and orophanx. Nl external ear canals without cough reflex   NECK :  without JVD/Nodes/TM/ nl carotid upstrokes bilaterally   LUNGS: no acc muscle use, lots of transmitted wheezing but some better p neb   CV:  RRR  no s3 or murmur or increase in P2, no edema   ABD:  soft and nontender with nl excursion in the supine position. No bruits or organomegaly, bowel sounds nl  MS:  warm without deformities, calf tenderness, cyanosis or clubbing  SKIN: warm and dry without lesions    NEURO:  alert, approp, no deficits       CXR  09/30/2012 :   Hyperinflation without acute finding.        Assessment & Plan:

## 2012-09-30 NOTE — Patient Instructions (Addendum)
Plan A = Automatic = Dulera 200 Take 2 puffs first thing in am and then another 2 puffs about 12 hours later  Prednisone 10 mg take  4 each am x 2 days,   2 each am  Until 100% better and no need for nebulizer then 1 daily x 3 days then one half daily until seen  Pantoprazole (protonix) 40 mg   Take 30-60 min before first meal of the day and Pepcid 20 mg one bedtime until return to office   Plan B = Backup = proair up to 2 puffs every 4 hours if needed   Plan C = Backup proair with the neb up to every 4 hours if needed   Please remember to go to the x-ray department downstairs for your tests - we will call you with the results when they are available.  Please schedule a follow up office visit in 2 weeks, sooner if needed

## 2012-10-01 NOTE — Assessment & Plan Note (Signed)
Adequate control on present rx, reviewed > no change in rx needed   

## 2012-10-01 NOTE — Assessment & Plan Note (Signed)
The proper method of use, as well as anticipated side effects, of a metered-dose inhaler are discussed and demonstrated to the patient. Improved effectiveness after extensive coaching during this visit to a level of approximately  75% so start dulera 200 2bid   ? Acid (or non-acid) GERD > always difficult to exclude as up to 75% of pts in some series report no assoc GI/ Heartburn symptoms> rec max (24h)  acid suppression and diet restrictions/ reviewed and instructions given in writting

## 2012-10-01 NOTE — Assessment & Plan Note (Signed)
-   d/c acei 09/10/2012   Too soon to tell to what extent the problem was exacerbated by acei, leave off indefinitely

## 2012-10-03 NOTE — Progress Notes (Signed)
Quick Note:  Spoke with pt and notified of results per Dr. Wert. Pt verbalized understanding and denied any questions.  ______ 

## 2012-10-14 ENCOUNTER — Telehealth: Payer: Self-pay | Admitting: Internal Medicine

## 2012-10-14 NOTE — Telephone Encounter (Signed)
Spoke with the pt's spouse  She is needing samples of benicar to last until pt's next appt  2 boxes up front for pick up  Nothing further needed

## 2012-10-21 ENCOUNTER — Ambulatory Visit: Payer: 59 | Admitting: Internal Medicine

## 2012-10-28 ENCOUNTER — Encounter: Payer: Self-pay | Admitting: Internal Medicine

## 2012-10-28 ENCOUNTER — Ambulatory Visit (INDEPENDENT_AMBULATORY_CARE_PROVIDER_SITE_OTHER): Payer: 59 | Admitting: Internal Medicine

## 2012-10-28 VITALS — BP 120/70 | HR 68 | Temp 98.0°F | Ht 70.0 in | Wt 245.0 lb

## 2012-10-28 DIAGNOSIS — R05 Cough: Secondary | ICD-10-CM

## 2012-10-28 DIAGNOSIS — J45909 Unspecified asthma, uncomplicated: Secondary | ICD-10-CM | POA: Insufficient documentation

## 2012-10-28 DIAGNOSIS — I10 Essential (primary) hypertension: Secondary | ICD-10-CM

## 2012-10-28 LAB — PULMONARY FUNCTION TEST

## 2012-10-28 MED ORDER — OLMESARTAN MEDOXOMIL 20 MG PO TABS: ORAL_TABLET | ORAL | Status: DC

## 2012-10-28 MED ORDER — MOMETASONE FURO-FORMOTEROL FUM 200-5 MCG/ACT IN AERO: INHALATION_SPRAY | RESPIRATORY_TRACT | Status: DC

## 2012-10-28 NOTE — Progress Notes (Signed)
Subjective:    Patient ID: [facility], male    DOB: 1955/01/22   MRN: [id number]  Brief patient profile:  69 yowm sickly as child dx as asthma took shots thru age 57 and improved didn't take daily meds flares sev times a year but no trouble with aerobic sports and   quit smoking around age 57 more freq flares and need for advair since around 2009 and singulair and symbicort and much worse x 2011 while on lisinopril with most most recent admit to [facility] hospital April 2014 referred by [facility] 09/09/2012 to pulmonary clinic for dtc asthma.     History of Present Illness  09/09/2012 1st [facility] office visit/ [facility] on ACEi cc daily cough / congestion x 3 years some better since last round of prednisone but still feel need for saba neb up to twice daily, no purulent sputum but severe cough with mopstly mucoid sputum during flares which are more freq since admit as below    Admit date: 04/24/2012  Discharge date: 04/26/2012    Recommendations for Outpatient Follow-up:  Establish care with a primary care physician for long term care of your asthma and medical maintenance issues.  Discharge Diagnoses:  Principal Problem:  Asthma with acute exacerbation  Active Problems:  PNA (pneumonia)  HTN (hypertension)  Acute respiratory failure with hypoxia  Discharge Condition: stable, still with mild wheeze and SOB  Diet recommendation: Heart healthy  Filed Weights    04/24/12 1359  04/24/12 2103   Weight:  99.791 kg (220 lb)  101.7 kg (224 lb 3.3 oz)   History of present illness:  57 yo male h/o htn and asthma since childhood with over one week of sob, wheezing. Ran out of his albuterol at home. Did take several days of his wife's augmentin about a week ago. No fevers at home. cough nonprod. Nonsmoker. No n/v/d. Went to [facility] transferred here for further eval, was tachypneic and wheezing with some hypoxia there, this is majorly improved with tx of albuterol, predisone and levaquin. Pt  developed a rash after levaquin given at [facility] center. His resp status is back to his baseline now. No cp. No le edema or swelling.  Hospital Course:  Acute asthma exacerbation causing acute respiratory failure with hypoxia.  [facility] was treated with Nebulizers, antibiotics and Steroids. He improved over 48 hours and while he is still wheezing, he is stable enough to recover at home. He will be discharged with prescriptions for albuterol nebs, albuterol inhaler, singulair, Ceftin and Azithromycin.  Community acquired Pneumonia  CXR showed possible Left lingular PNA, but this may be atelectasis. Prescribed ceftin and azithromycin for a total of 5 days of antibiotic treatment.  Hypertension  Controlled on Lisinopril.   Wife says wheezing loudly intermittently while sleeping  rec Stop lisinopril - it may be fueling your problems and takes a few weeks to wear off Start benicar 20 mg one daily  Only nebulizer if you need it for breathing or cough mucinex dm is good for the cough GERD  Diet  If not better: Prednisone 10 mg take  4 each am x 2 days,   2 each am x 2 days,  1 each am x 2 days and stop Pantoprazole (protonix) 40 mg   Take 30-60 min before first meal of the day and Pepcid 20 mg one bedtime until return to office    09/30/2012 f/u ov/[facility] re: refractory cough/ sob  Chief Complaint  Patient presents with  .  Acute Visit    SOB, wheezing and tightness in chest x1 week.  Also slight cough w/ yellowish mucus    Better on prednisone then worse off it - does not have saba hfa, totally reliant on neb rec Plan A = Automatic = Dulera 200 Take 2 puffs first thing in am and then another 2 puffs about 12 hours later Prednisone 10 mg take  4 each am x 2 days,   2 each am  Until 100% better and no need for nebulizer then 1 daily x 3 days then one half daily until seen Pantoprazole (protonix) 40 mg   Take 30-60 min before first meal of the day and Pepcid 20 mg one bedtime until return to office   Plan B = Backup = proair up to 2 puffs every 4 hours if needed  Plan C = Backup proair with the neb up to every 4 hours if needed    10/28/2012 f/u ov/[facility] re: f/u asthma Chief Complaint  Patient presents with  . Followup with PFT    Pt states that his cough has resolved since last visit. Breathing is much improved and having less chest tightness. He has used rescue inhaler approx 4 times since his last ov.     No limiting doe   No obvious day to day or daytime variabilty or assoc  cp or chest tightness, subjective wheeze overt sinus or hb symptoms. No unusual exp hx or h/o childhood pna/ asthma or knowledge of premature birth.  Sleeping ok without nocturnal  or early am exacerbation  of respiratory  c/o's or need for noct saba. Also denies any obvious fluctuation of symptoms with weather or environmental changes or other aggravating or alleviating factors except as outlined above   Current Medications, Allergies, Complete Past Medical History, Past Surgical History, Family History, and Social History were reviewed in [employer] record.  ROS  The following are not active complaints unless bolded sore throat, dysphagia, dental problems, itching, sneezing,  nasal congestion or excess/ purulent secretions, ear ache,   fever, chills, sweats, unintended wt loss, pleuritic or exertional cp, hemoptysis,  orthopnea pnd or leg swelling, presyncope, palpitations, heartburn, abdominal pain, anorexia, nausea, vomiting, diarrhea  or change in bowel or urinary habits, change in stools or urine, dysuria,hematuria,  rash, arthralgias, visual complaints, headache, numbness weakness or ataxia or problems with walking or coordination,  change in mood/affect or memory.                 Objective:   Physical Exam  amb mod obese wm nad no longer with   prominent pseudowheeze  (none at all)   09/30/2012      234 > 10/28/2012  245  Wt Readings from Last 3 Encounters:  09/09/12 235  lb (106.595 kg)  04/24/12 224 lb 3.3 oz (101.7 kg)  08/22/11 215 lb (97.523 kg)     HEENT: nl dentition, turbinates, and orophanx. Nl external ear canals without cough reflex   NECK :  without JVD/Nodes/TM/ nl carotid upstrokes bilaterally   LUNGS: no acc muscle use, clear to A and P p albuterol for pft   CV:  RRR  no s3 or murmur or increase in P2, no edema   ABD:  soft and nontender with nl excursion in the supine position. No bruits or organomegaly, bowel sounds nl  MS:  warm without deformities, calf tenderness, cyanosis or clubbing  SKIN: warm and dry without lesions    NEURO:  alert,  approp, no deficits       CXR  09/30/2012 :   Hyperinflation without acute finding.        Assessment & Plan:

## 2012-10-28 NOTE — Progress Notes (Signed)
PFT done. Teniola Tseng, CMA  

## 2012-10-28 NOTE — Patient Instructions (Addendum)
Stay on dulera 200 Take 2 puffs first thing in am and then another 2 puffs about 12 hours later.   Only use your albuterol (ventolin) as a rescue medication to be used if you can't catch your breath by resting or doing a relaxed purse lip breathing pattern.  - The less you use it, the better it will work when you need it. - Ok to use up to every 4 hours if you must but call for immediate appointment if use goes up over your usual need - Don't leave home without it !!  (think of it like your spare tire for your car)   Stay on singulair  Fill rx for benicar 20 mg one daily - call if insurance issue  Try off protonix (pantoprazole) and pepcid to see what difference if any it makes and restart automatically if breathing or coughing worsen  Please schedule a follow up visit in 3 months but call sooner if needed

## 2012-10-29 NOTE — Assessment & Plan Note (Signed)
-   d/c acei 09/10/2012   Suspect this was mostly acei related but could also be partly GERD/LPR > try off acid suppression and see if flares, if so restart

## 2012-10-29 NOTE — Assessment & Plan Note (Addendum)
-   PFT's 10/28/12 FEV1  2.50 (67%) ratio 67 and no change p B2 (p dulera am of pft) and dlco nl  The proper method of use, as well as anticipated side effects, of a metered-dose inhaler are discussed and demonstrated to the patient. Improved effectiveness after extensive coaching during this visit to a level of approximately  90%   He has had a dramatic change from first visit symptomatically with elimination of acei and improved hfa technique to point where hardly has any symptoms of evidence of airflow obst at all  Therefore All goals of chronic asthma control met including optimal (though not completely nl)   function and elimination of symptoms with minimal need for rescue therapy.  Contingencies discussed in full including contacting this office immediately if not controlling the symptoms using the rule of two's.    Each maintenance medication was reviewed in detail including most importantly the difference between maintenance and as needed and under what circumstances the prns are to be used.  Please see instructions for details which were reviewed in writing and the patient given a copy.

## 2012-10-29 NOTE — Assessment & Plan Note (Signed)
Change acei to benicar 09/10/2012 due to pseudoasthma > resolved completely so reluctant to ever challenge him again

## 2012-11-11 ENCOUNTER — Encounter: Payer: Self-pay | Admitting: Internal Medicine

## 2013-02-03 ENCOUNTER — Ambulatory Visit (INDEPENDENT_AMBULATORY_CARE_PROVIDER_SITE_OTHER): Payer: 59 | Admitting: Internal Medicine

## 2013-02-03 ENCOUNTER — Encounter: Payer: Self-pay | Admitting: Internal Medicine

## 2013-02-03 VITALS — BP 130/82 | HR 72 | Temp 98.4°F | Ht 70.0 in | Wt 249.2 lb

## 2013-02-03 DIAGNOSIS — J45909 Unspecified asthma, uncomplicated: Secondary | ICD-10-CM

## 2013-02-03 DIAGNOSIS — R05 Cough: Secondary | ICD-10-CM

## 2013-02-03 DIAGNOSIS — R059 Cough, unspecified: Secondary | ICD-10-CM

## 2013-02-03 DIAGNOSIS — I10 Essential (primary) hypertension: Secondary | ICD-10-CM

## 2013-02-03 NOTE — Progress Notes (Signed)
Subjective:    Patient ID: Leonard [facility], male    DOB: [birth year]   MRN: [id number]  Brief patient profile:  45 yowm sickly as child dx as asthma took shots thru age 58 and improved didn't take daily meds flares sev times a year but no trouble with aerobic sports and   quit smoking around age 33 more freq flares and need for advair since around 2009 and singulair and symbicort and much worse x 2011 while on lisinopril with most most recent admit to [facility] April 2014 referred by [facility] 09/09/2012 [facility] with minimal airflow obstruction 10/28/12     History of Present Illness  09/09/2012 1st [facility] office visit/ [facility] on ACEi cc daily cough / congestion x 3 years some better since last round of prednisone but still feel need for saba neb up to twice daily, no purulent sputum but severe cough with mopstly mucoid sputum during flares which are more freq since admit as below    Admit date: 04/24/2012  Discharge date: 04/26/2012     Discharge Diagnoses:  Principal Problem:  Asthma with acute exacerbation  Active Problems:  PNA (pneumonia)  HTN (hypertension)  Acute respiratory failure with hypoxia  Discharge Condition: stable, still with mild wheeze and SOB  Diet recommendation: Heart healthy  Filed Weights    04/24/12 1359  04/24/12 2103   Weight:  99.791 kg (220 lb)  101.7 kg (224 lb 3.3 oz)   History of present illness:  58 yo male h/o htn and asthma since childhood with over one week of sob, wheezing. Ran out of his albuterol at home. Did take several days of his wife's augmentin about a week ago. No fevers at home. cough nonprod. Nonsmoker. No n/v/d. Went to [facility] transferred here for further eval, was tachypneic and wheezing with some hypoxia there, this is majorly improved with tx of albuterol, predisone and levaquin. Pt developed a rash after levaquin given at [facility]. His resp status is back to his baseline now. No cp. No le edema or  swelling.  Hospital Course:  Acute asthma exacerbation causing acute respiratory failure with hypoxia.  Mr. [facility] was treated with Nebulizers, antibiotics and Steroids. He improved over 48 hours and while he is still wheezing, he is stable enough to recover at home. He will be discharged with prescriptions for albuterol nebs, albuterol inhaler, singulair, Ceftin and Azithromycin.  Community acquired Pneumonia  CXR showed possible Left lingular PNA, but this may be atelectasis. Prescribed ceftin and azithromycin for a total of 5 days of antibiotic treatment.  Hypertension  Controlled on Lisinopril.   Wife says wheezing loudly intermittently while sleeping  rec Stop lisinopril - it may be fueling your problems and takes a few weeks to wear off Start benicar 20 mg one daily  Only nebulizer if you need it for breathing or cough mucinex dm is good for the cough GERD  Diet  If not better: Prednisone 10 mg take  4 each am x 2 days,   2 each am x 2 days,  1 each am x 2 days and stop Pantoprazole (protonix) 40 mg   Take 30-60 min before first meal of the day and Pepcid 20 mg one bedtime until return to office    09/30/2012 f/u ov/[facility] re: refractory cough/ sob  Chief Complaint  Patient presents with  . Acute Visit    SOB, wheezing and tightness in chest x1 week.  Also slight cough w/  yellowish mucus    Better on prednisone then worse off it - does not have saba hfa, totally reliant on neb rec Plan A = Automatic = Dulera 200 Take 2 puffs first thing in am and then another 2 puffs about 12 hours later Prednisone 10 mg take  4 each am x 2 days,   2 each am  Until 100% better and no need for nebulizer then 1 daily x 3 days then one half daily until seen Pantoprazole (protonix) 40 mg   Take 30-60 min before first meal of the day and Pepcid 20 mg one bedtime until return to office  Plan B = Backup = proair up to 2 puffs every 4 hours if needed  Plan C = Backup proair with the neb up to every 4  hours if needed    10/28/2012 f/u ov/[facility] re: f/u asthma Chief Complaint  Patient presents with  . Followup with PFT    Pt states that his cough has resolved since last visit. Breathing is much improved and having less chest tightness. He has used rescue inhaler approx 4 times since his last ov.    No limiting doe rec Stay on dulera 200 Take 2 puffs first thing in am and then another 2 puffs about 12 hours later.  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 Follow-up    Breathing doing well--no concerns today     No obvious day to day or daytime variabilty or assoc cough or  cp or chest tightness, subjective wheeze overt sinus or hb symptoms. No unusual exp hx or h/o childhood pna/ asthma or knowledge of premature birth.  Sleeping ok without nocturnal  or early am exacerbation  of respiratory  c/o's or need for noct saba. Also denies any obvious fluctuation of symptoms with weather or environmental changes or other aggravating or alleviating factors except as outlined above   Current Medications, Allergies, Complete Past Medical History, Past Surgical History, Family History, and Social History were reviewed in [employer] record.  ROS  The following are not active complaints unless bolded sore throat, dysphagia, dental problems, itching, sneezing,  nasal congestion or excess/ purulent secretions, ear ache,   fever, chills, sweats, unintended wt loss, pleuritic or exertional cp, hemoptysis,  orthopnea pnd or leg swelling, presyncope, palpitations, heartburn, abdominal pain, anorexia, nausea, vomiting, diarrhea  or change in bowel or urinary habits, change in stools or urine, dysuria,hematuria,  rash, arthralgias, visual  complaints, headache, numbness weakness or ataxia or problems with walking or coordination,  change in mood/affect or memory.                 Objective:   Physical Exam  amb mod obese wm nad     09/30/2012      234 > 10/28/2012  245 > 249 02/03/13  Wt Readings from Last 3 Encounters:  09/09/12 235 lb (106.595 kg)  04/24/12 224 lb 3.3 oz (101.7 kg)  08/22/11 215 lb (97.523 kg)     HEENT: nl dentition, turbinates, and orophanx. Nl external ear canals without cough reflex   NECK :  without JVD/Nodes/TM/ nl carotid upstrokes bilaterally   LUNGS: no acc muscle use, clear to A and P  p albuterol for pft   CV:  RRR  no s3 or murmur or increase in P2, no edema   ABD:  soft and nontender with nl excursion in the supine position. No bruits or organomegaly, bowel sounds nl  MS:  warm without deformities, calf tenderness, cyanosis or clubbing           CXR  09/30/2012 :   Hyperinflation without acute finding.        Assessment & Plan:

## 2013-02-03 NOTE — Patient Instructions (Addendum)
Stop singulair to see what difference if any this makes and resume if note any worsening  Please schedule a follow up visit in 3 months but call sooner if needed

## 2013-02-05 NOTE — Assessment & Plan Note (Signed)
Change acei to benicar 09/10/2012 due to pseudoasthma > resolved 10/28/12  Adequate control on present rx, reviewed > no change in rx needed  = benicar 20 mg daily (or similar arb)

## 2013-02-05 NOTE — Assessment & Plan Note (Signed)
-  PFT's 10/28/12 FEV1  2.50 (67%) ratio 67 and no change p B2 (p dulera am of pft) and dlco nl  - 02/03/2013 p extensive coaching HFA effectiveness =    90%   The proper method of use, as well as anticipated side effects, of a metered-dose inhaler are discussed and demonstrated to the patient. Improved effectiveness after extensive coaching during this visit to a level of approximately    All goals of chronic asthma control met including optimal function and elimination of symptoms with minimal need for rescue therapy.  Contingencies discussed in full including contacting this office immediately if not controlling the symptoms using the rule of two's.

## 2013-02-05 NOTE — Assessment & Plan Note (Signed)
-   d/c acei 09/10/2012  - trial off gerd rx started 10/29/2012   Clearly better off acei, would not rechallenge

## 2013-02-12 ENCOUNTER — Other Ambulatory Visit: Payer: Self-pay | Admitting: Internal Medicine

## 2013-03-06 ENCOUNTER — Telehealth: Payer: Self-pay | Admitting: Internal Medicine

## 2013-03-06 NOTE — Telephone Encounter (Signed)
Spoke with the pt's spouse  She states that the coupon for [facility] was expired I checked and we do not have any more at this time Pt was able to get med  Nothing further needed

## 2013-06-15 ENCOUNTER — Telehealth: Payer: Self-pay | Admitting: *Deleted

## 2013-06-15 NOTE — Telephone Encounter (Signed)
lmomtcb x1 

## 2013-06-15 NOTE — Telephone Encounter (Signed)
Received PA for benicar 20 mg PA # [id number]  [phone] Please advise MW if you would like for [place] to start PA? No alternatives given thanks

## 2013-06-15 NOTE — Telephone Encounter (Signed)
Try to find what the formulary alternatives are and if there are none then ok to do PA

## 2013-06-22 ENCOUNTER — Telehealth: Payer: Self-pay | Admitting: Internal Medicine

## 2013-06-22 NOTE — Telephone Encounter (Signed)
Received fax of covered alternatives. Will place in MW's look-at.  Called and spoke to pt's wife and stated we would forward this to MW and will get back to her, pt has been off Benicar for 4 days.  Will forward to [facility] and MW.

## 2013-06-22 NOTE — Telephone Encounter (Signed)
Tanda Rockers, MD at 06/15/2013  5:15 PM      Status: Signed            Try to find what the formulary alternatives are and if there are none then ok to do PA   -----  Inge Rise, CMA at 06/15/2013  3:21 PM      Status: Signed            Received PA for benicar 20 mg PA # [id number]   [phone] Please advise MW if you would like for [place] to start PA? No alternatives given thanks  -----  I called spoke with pt spouse. She will call pt insurance and see what alternative is to benicar if any. Will await call back

## 2013-06-22 NOTE — Telephone Encounter (Signed)
benicar is tier 2 (which shouldn't be that expensive) but there more listed than this on the formulary but the next page is missing and they are alphabetical so there may be many more listed than that

## 2013-06-22 NOTE — Telephone Encounter (Signed)
Wife is faxing ins card and the formulary alternatives  [id number] he is completely out

## 2013-06-22 NOTE — Telephone Encounter (Signed)
LMTCB

## 2013-06-23 MED ORDER — TELMISARTAN 80 MG PO TABS: 80.0000 mg | ORAL_TABLET | Freq: Every day | ORAL | Status: DC

## 2013-06-23 NOTE — Telephone Encounter (Signed)
micardis is Tier 1 dose is 80 mg daily

## 2013-06-23 NOTE — Telephone Encounter (Signed)
Called and spoke with spouse. She is aware. She will call us back once she gets all the pages together We did not have any samples of the benicar

## 2013-06-23 NOTE — Telephone Encounter (Signed)
Souse brought complete formulary and placed in MW look at. Please advise thanks

## 2013-06-23 NOTE — Telephone Encounter (Signed)
I called spouse and is aware of recs. RX sent in. Nothing further needed

## 2013-06-23 NOTE — Telephone Encounter (Signed)
pts wife is in lobby to talk to nurse about benicar.

## 2013-11-27 ENCOUNTER — Telehealth: Payer: Self-pay | Admitting: Internal Medicine

## 2013-11-27 MED ORDER — MOMETASONE FURO-FORMOTEROL FUM 200-5 MCG/ACT IN AERO: INHALATION_SPRAY | RESPIRATORY_TRACT | Status: DC

## 2013-11-27 MED ORDER — ALBUTEROL SULFATE HFA 108 (90 BASE) MCG/ACT IN AERS: 2.0000 | INHALATION_SPRAY | RESPIRATORY_TRACT | Status: DC | PRN

## 2013-11-27 NOTE — Telephone Encounter (Signed)
Pt was seen 02/03/13 and told to f/u in 3 months. No appt pending. Called spoke with spouse. Aware RX has been sent in and will have pt keep pending appt in December. Nothing further needed

## 2013-12-15 ENCOUNTER — Ambulatory Visit (INDEPENDENT_AMBULATORY_CARE_PROVIDER_SITE_OTHER): Payer: BC Managed Care – PPO | Admitting: Internal Medicine

## 2013-12-15 ENCOUNTER — Encounter: Payer: Self-pay | Admitting: Internal Medicine

## 2013-12-15 VITALS — BP 130/64 | HR 73 | Ht 70.0 in | Wt 234.0 lb

## 2013-12-15 DIAGNOSIS — I10 Essential (primary) hypertension: Secondary | ICD-10-CM

## 2013-12-15 DIAGNOSIS — J4540 Moderate persistent asthma, uncomplicated: Secondary | ICD-10-CM

## 2013-12-15 MED ORDER — MOMETASONE FURO-FORMOTEROL FUM 200-5 MCG/ACT IN AERO: INHALATION_SPRAY | RESPIRATORY_TRACT | Status: DC

## 2013-12-15 MED ORDER — MOMETASONE FURO-FORMOTEROL FUM 200-5 MCG/ACT IN AERO: INHALATION_SPRAY | RESPIRATORY_TRACT | Status: AC

## 2013-12-15 NOTE — Patient Instructions (Addendum)
Ok to take dulera 200 2 puffs each am and leave off pm dose if doing great  Only use your albuterol(ventolin)  as a rescue medication to be used if you can't catch your breath by resting or doing a relaxed purse lip breathing pattern.  - The less you use it, the better it will work when you need it. - Ok to use up to 2 puffs  every 4 hours if you must but call for immediate appointment if use goes up over your usual need - Don't leave home without it !!  (think of it like the spare tire for your car)    If you are satisfied with your treatment plan,  let your doctor know and he/she can either refill your medications or you can return here when your prescription runs out.     If in any way you are not 100% satisfied,  please tell us.  If 100% better, tell your friends!  Pulmonary follow up is as needed

## 2013-12-15 NOTE — Progress Notes (Signed)
Subjective:    Patient ID: Leonard Hall, male    DOB: 1955-03-26   MRN: [id number]  Brief patient profile:  75 yowm sickly as child dx as asthma took shots thru age 58 and improved didn't take daily meds flares sev times a year but no trouble with aerobic sports and   quit smoking around age 89 more freq flares and need for advair since around 2009 and singulair and symbicort and much worse x 2011 while on lisinopril with most most recent admit to [facility] April 2014 referred by Leonard Hall 09/09/2012 to pulmonary clinic for dtc asthma with minimal airflow obstruction on PFTs 10/28/12     History of Present Illness  09/09/2012 1st [facility] office visit/ Leonard Hall on ACEi cc daily cough / congestion x 3 years some better since last round of prednisone but still feel need for saba neb up to twice daily, no purulent sputum but severe cough with mostly mucoid sputum during flares which are more freq since admit as below    Admit date: 04/24/2012  Discharge date: 04/26/2012     Discharge Diagnoses:  Principal Problem:  Asthma with acute exacerbation  Active Problems:  PNA (pneumonia)  HTN (hypertension)  Acute respiratory failure with hypoxia  Discharge Condition: stable, still with mild wheeze and SOB  Diet recommendation: Heart healthy  Filed Weights    04/24/12 1359  04/24/12 2103   Weight:  99.791 kg (220 lb)  101.7 kg (224 lb 3.3 oz)   History of present illness:  58 yo male h/o htn and asthma since childhood with over one week of sob, wheezing. Ran out of his albuterol at home. Did take several days of his wife's augmentin about a week ago. No fevers at home. cough nonprod. Nonsmoker. No n/v/d. Went to [facility] transferred here for further eval, was tachypneic and wheezing with some hypoxia there, this is majorly improved with tx of albuterol, predisone and levaquin. Pt developed a rash after levaquin given at [facility] center. His resp status is back to his baseline now. No cp. No le  edema or swelling.  Hospital Course:  Acute asthma exacerbation causing acute respiratory failure with hypoxia.  [name] was treated with Nebulizers, antibiotics and Steroids. He improved over 48 hours and while he is still wheezing, he is stable enough to recover at home. He will be discharged with prescriptions for albuterol nebs, albuterol inhaler, singulair, Ceftin and Azithromycin.  Community acquired Pneumonia  CXR showed possible Left lingular PNA, but this may be atelectasis. Prescribed ceftin and azithromycin for a total of 5 days of antibiotic treatment.  Hypertension  Controlled on Lisinopril.   Wife says wheezing loudly intermittently while sleeping  rec Stop lisinopril - it may be fueling your problems and takes a few weeks to wear off Start benicar 20 mg one daily  Only nebulizer if you need it for breathing or cough mucinex dm is good for the cough GERD  Diet  If not better: Prednisone 10 mg take  4 each am x 2 days,   2 each am x 2 days,  1 each am x 2 days and stop Pantoprazole (protonix) 40 mg   Take 30-60 min before first meal of the day and Pepcid 20 mg one bedtime until return to office    10/28/2012 f/u ov/Leonard Hall re: f/u asthma Chief Complaint  Patient presents with  . Followup with PFT    Pt states that his cough has resolved since last visit. Breathing is  much improved and having less chest tightness. He has used rescue inhaler approx 4 times since his last ov.    No limiting doe rec Stay on dulera 200 Take 2 puffs first thing in am and then another 2 puffs about 12 hours later.  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 Follow-up    Breathing doing well--no concerns today    rec  try off  singulair   12/15/2013 f/u ov/Leonard Hall re: asthma / dulera 200 2bid  No worse off singulair  Chief Complaint  Patient presents with  . Follow-up    Pt states that his breathing has been doing well. He rarely uses rescue inhaler and never uses neb.      Not limited by breathing from desired activities    No obvious day to day or daytime variabilty or assoc cough or  cp or chest tightness, subjective wheeze overt sinus or hb symptoms. No unusual exp hx or h/o childhood pna/ asthma or knowledge of premature birth.  Sleeping ok without nocturnal  or early am exacerbation  of respiratory  c/o's or need for noct saba. Also denies any obvious fluctuation of symptoms with weather or environmental changes or other aggravating or alleviating factors except as outlined above   Current Medications, Allergies, Complete Past Medical History, Past Surgical History, Family History, and Social History were reviewed in [employer] record.  ROS  The following are not active complaints unless bolded sore throat, dysphagia, dental problems, itching, sneezing,  nasal congestion or excess/ purulent secretions, ear ache,   fever, chills, sweats, unintended wt loss, pleuritic or exertional cp, hemoptysis,  orthopnea pnd or leg swelling, presyncope, palpitations, heartburn, abdominal pain, anorexia, nausea, vomiting, diarrhea  or change in bowel or urinary habits, change in stools or urine, dysuria,hematuria,  rash, arthralgias, visual complaints, headache, numbness weakness or ataxia or problems with walking or coordination,  change in mood/affect or memory.                 Objective:   Physical Exam  amb mod obese wm nad     09/30/2012      234 > 10/28/2012  245 > 249 02/03/13 > 12/15/2013 234  Wt Readings from Last 3 Encounters:  09/09/12 235 lb (106.595 kg)  04/24/12 224 lb 3.3 oz (101.7 kg)  08/22/11 215 lb (97.523 kg)     HEENT: nl dentition, turbinates, and orophanx. Nl external  ear canals without cough reflex   NECK :  without JVD/Nodes/TM/ nl carotid upstrokes bilaterally   LUNGS: no acc muscle use, clear to A and P     CV:  RRR  no s3 or murmur or increase in P2, no edema   ABD:  soft and nontender with nl excursion in the supine position. No bruits or organomegaly, bowel sounds nl  MS:  warm without deformities, calf tenderness, cyanosis or clubbing           CXR  09/30/2012 : Hyperinflation without acute finding.        Assessment & Plan:   Outpatient Encounter Prescriptions as of 12/15/2013  Medication Sig  . albuterol (PROVENTIL HFA;VENTOLIN HFA) 108 (90 BASE) MCG/ACT inhaler Inhale 2 puffs into the lungs  every 4 (four) hours as needed for wheezing.  Marland Kitchen albuterol (PROVENTIL) (5 MG/ML) 0.5% nebulizer solution Take 1 mL (5 mg total) by nebulization every 6 (six) hours as needed for wheezing.  . mometasone-formoterol (DULERA) 200-5 MCG/ACT AERO Take 2 puffs first thing in am and then another 2 puffs about 12 hours later.  . telmisartan (MICARDIS) 80 MG tablet Take 1 tablet (80 mg total) by mouth daily.  . [DISCONTINUED] mometasone-formoterol (DULERA) 200-5 MCG/ACT AERO Take 2 puffs first thing in am and then another 2 puffs about 12 hours later.  . [DISCONTINUED] mometasone-formoterol (DULERA) 200-5 MCG/ACT AERO Take 2 puffs first thing in am and then another 2 puffs about 12 hours later.  . [DISCONTINUED] BENICAR 20 MG tablet TAKE 1 TABLET EVERY DAY

## 2013-12-16 NOTE — Assessment & Plan Note (Signed)
Change acei to benicar 09/10/2012 due to pseudoasthma > resolved 10/28/12  Adequate control on present rx, reviewed > no change in rx needed    Given the ambiguity in interpretation of resp symptoms in pts with chronic lung problems like asthma, I would avoid acei indefinitely

## 2013-12-16 NOTE — Assessment & Plan Note (Addendum)
-  PFT's 10/28/12 FEV1  2.50 (67%) ratio 67 and no change p B2 (p dulera am of pft) and dlco nl  - 02/03/2013 p extensive coaching HFA effectiveness =    90%   All goals of chronic asthma control met including optimal function and elimination of symptoms with minimal need for rescue therapy.  Contingencies discussed in full including contacting this office immediately if not controlling the symptoms using the rule of two's.     Ok to try dulera 200 2 each am as long as doing great due to pt concerns re cost (the 100 is just as expensive though consider this as step down thereapy in the future if does great on the 200 just 2 puffs daily )    Each maintenance medication was reviewed in detail including most importantly the difference between maintenance and as needed and under what circumstances the prns are to be used.  Please see instructions for details which were reviewed in writing and the patient given a copy.    Pulmonary f/u prn

## 2013-12-22 ENCOUNTER — Ambulatory Visit: Payer: BC Managed Care – PPO | Admitting: Internal Medicine

## 2014-01-29 ENCOUNTER — Telehealth: Payer: Self-pay | Admitting: Internal Medicine

## 2014-01-29 MED ORDER — TELMISARTAN 80 MG PO TABS: 80.0000 mg | ORAL_TABLET | Freq: Every day | ORAL | Status: AC

## 2014-01-29 NOTE — Telephone Encounter (Signed)
Per 12/15/13 OV w/ MW: If you are satisfied with your treatment plan,  let your doctor know and he/she can either refill your medications or you can return here when your prescription runs out.     If in any way you are not 100% satisfied,  please tell us.  If 100% better, tell your friends!  Pulmonary follow up is as needed  ---  Called spoke with spouse. Made aware of above. They only want refill sent for this month and will get further refills from PCP. Nothing further needed

## 2014-02-24 ENCOUNTER — Other Ambulatory Visit: Payer: Self-pay | Admitting: Internal Medicine

## 2014-04-20 ENCOUNTER — Other Ambulatory Visit: Payer: Self-pay | Admitting: Internal Medicine

## 2015-01-15 IMAGING — CR DG CHEST 2V
2 series · 2 of 2 positions shown · non-contrast
Comparison: 04/24/2012.

CLINICAL DATA: Shortness of breath and cough.

CHEST - 2 VIEW

[view not recorded (1 of 2)]
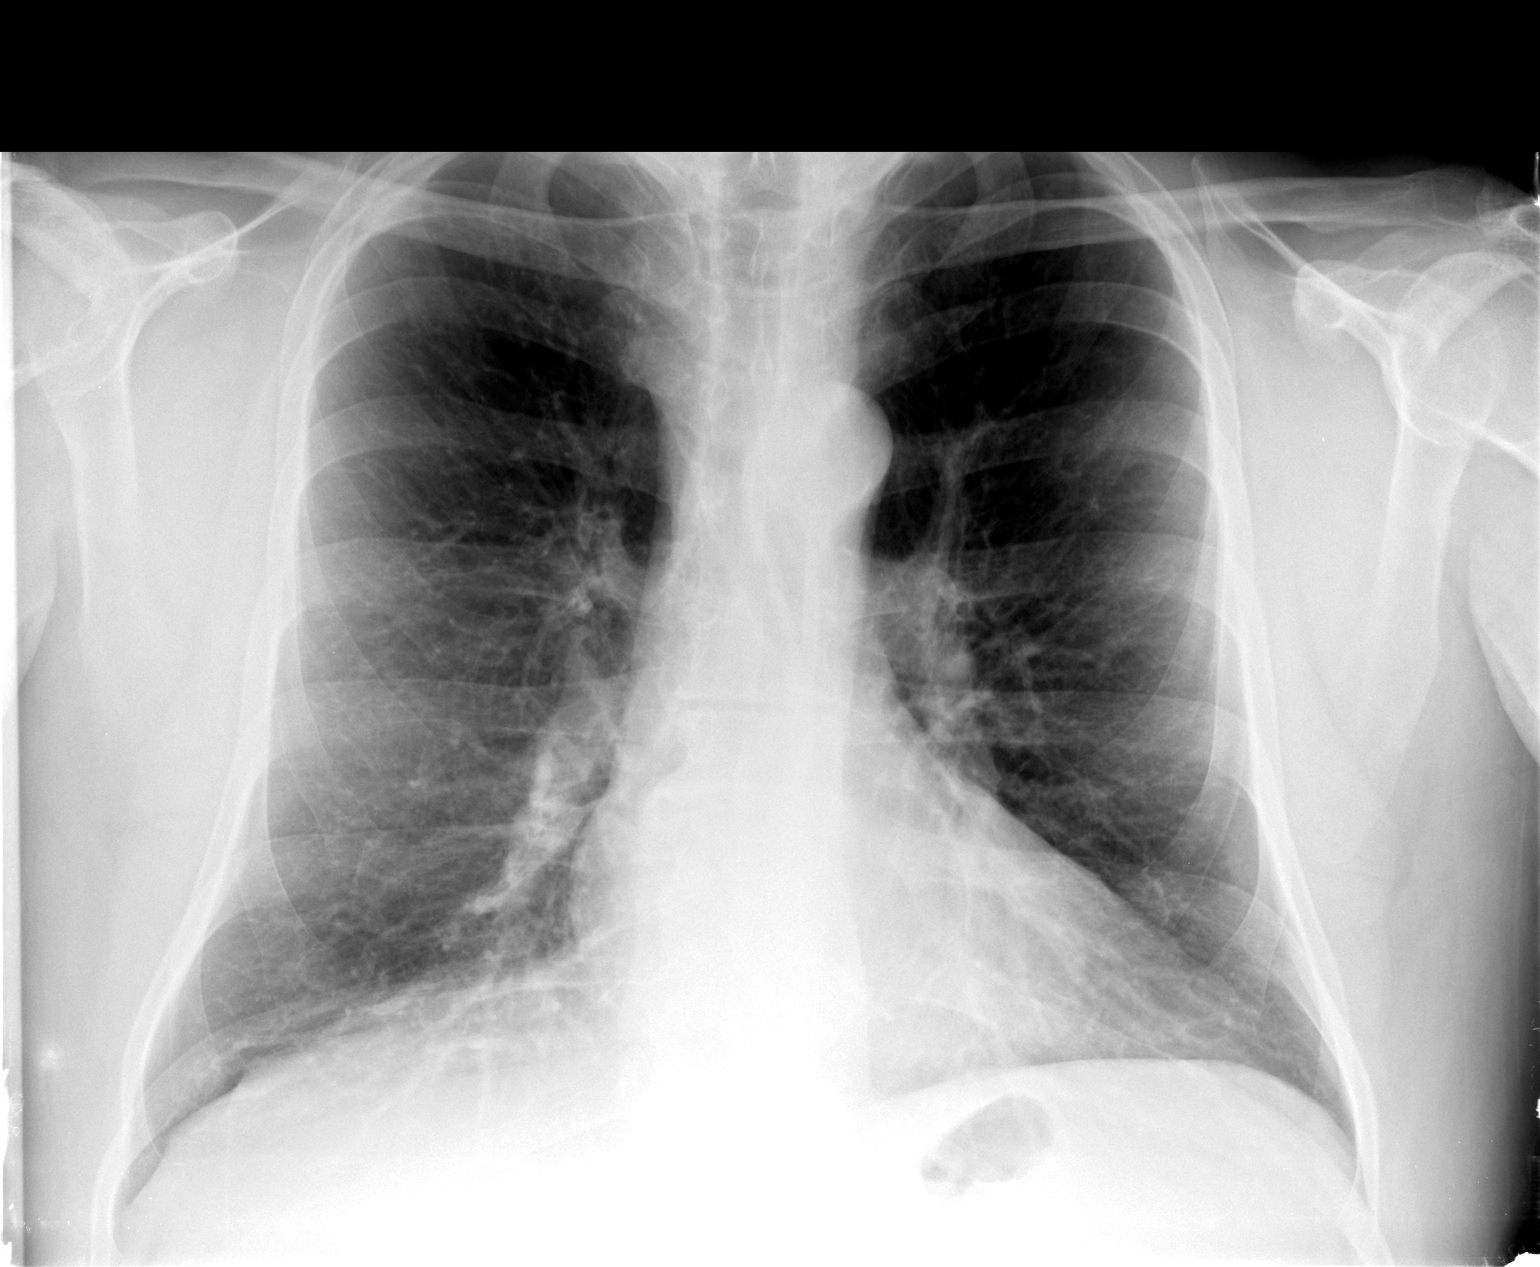

[view not recorded (2 of 2)]
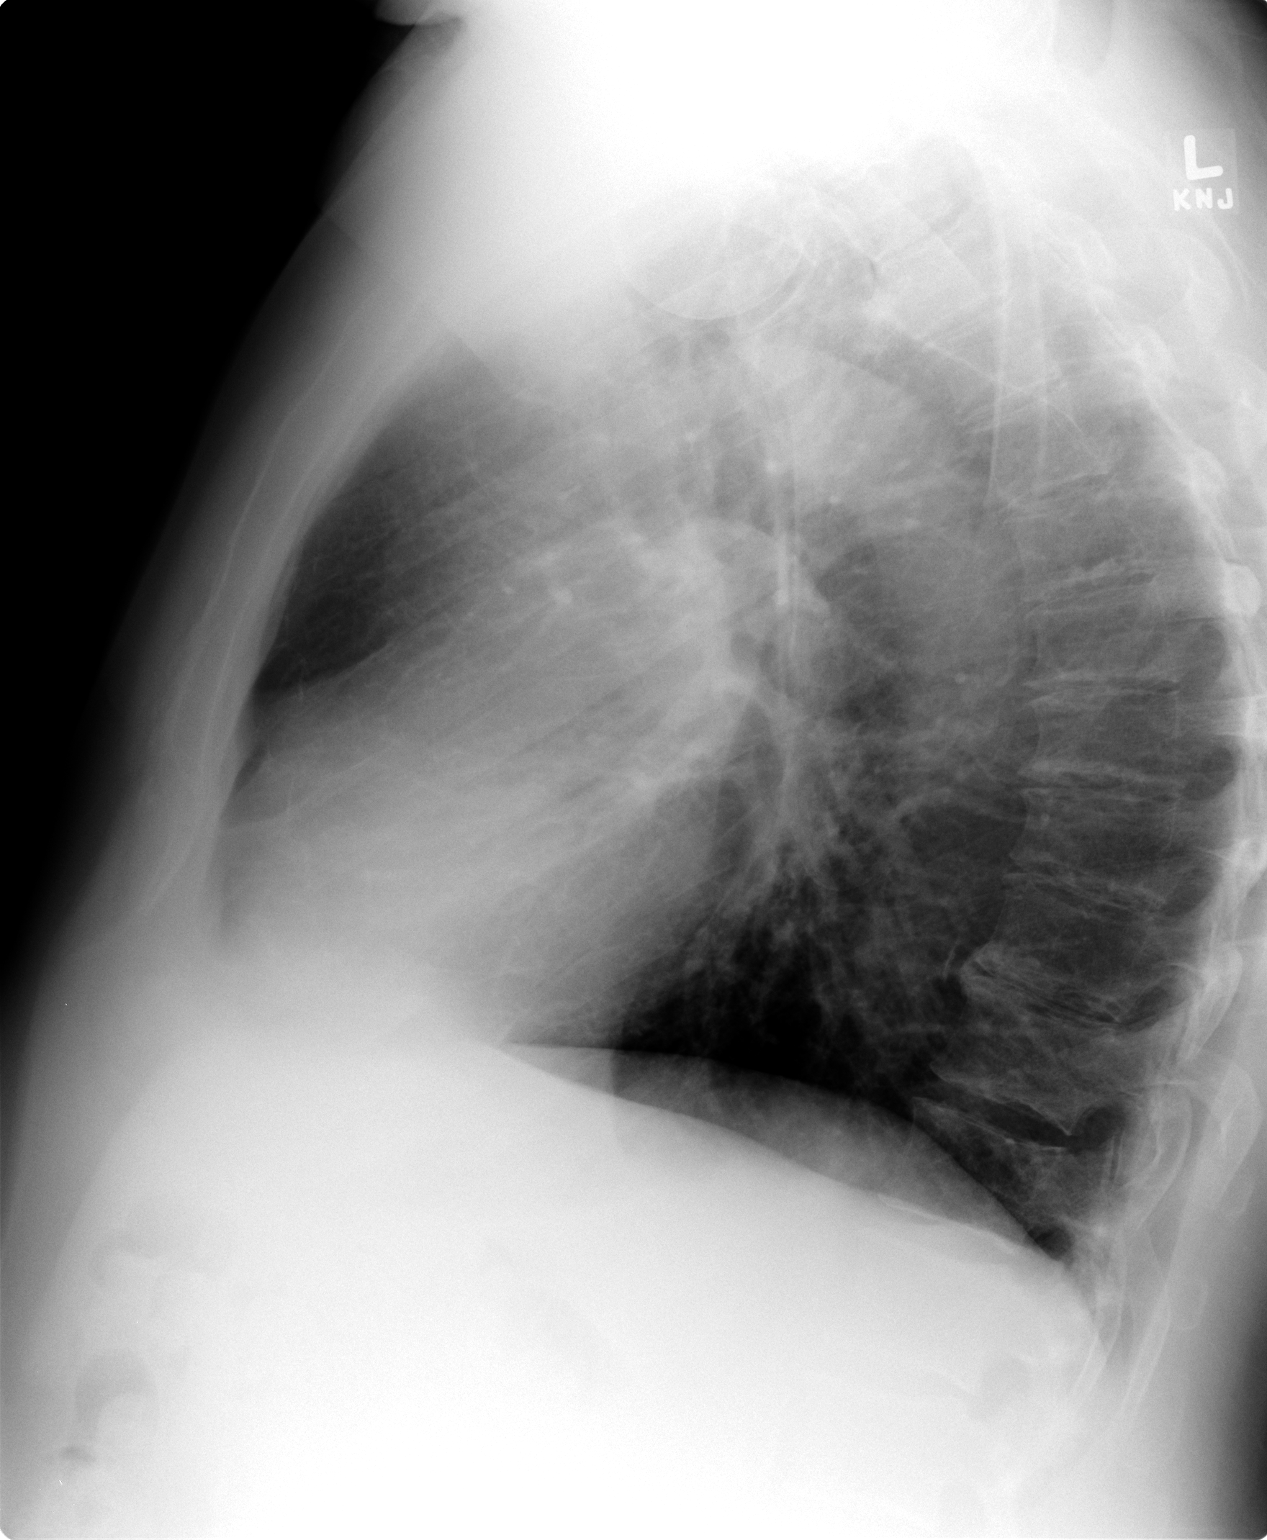

[2 of 2 positions shown; findings below may reference images not displayed]

FINDINGS: Trachea is midline.  Heart size normal.  Lungs are
hyperinflated with minimal scarring at the right lung base.  No
pleural fluid.  Mild degenerative changes are seen in the spine.
IMPRESSION: Hyperinflation without acute finding.

## 2018-10-06 ENCOUNTER — Other Ambulatory Visit: Payer: Self-pay

## 2018-10-06 ENCOUNTER — Emergency Department (HOSPITAL_BASED_OUTPATIENT_CLINIC_OR_DEPARTMENT_OTHER)
Admission: EM | Admit: 2018-10-06 | Discharge: 2018-10-06 | Disposition: A | Discharge status: Home / Self Care | Payer: PRIVATE HEALTH INSURANCE | Attending: Emergency Medicine | Admitting: Emergency Medicine

## 2018-10-06 ENCOUNTER — Encounter (HOSPITAL_BASED_OUTPATIENT_CLINIC_OR_DEPARTMENT_OTHER): Payer: Self-pay | Admitting: *Deleted

## 2018-10-06 DIAGNOSIS — I10 Essential (primary) hypertension: Secondary | ICD-10-CM | POA: Insufficient documentation

## 2018-10-06 DIAGNOSIS — R197 Diarrhea, unspecified: Secondary | ICD-10-CM | POA: Diagnosis not present

## 2018-10-06 DIAGNOSIS — R112 Nausea with vomiting, unspecified: Secondary | ICD-10-CM | POA: Insufficient documentation

## 2018-10-06 DIAGNOSIS — Z87891 Personal history of nicotine dependence: Secondary | ICD-10-CM | POA: Insufficient documentation

## 2018-10-06 DIAGNOSIS — U071 COVID-19: Secondary | ICD-10-CM | POA: Diagnosis present

## 2018-10-06 DIAGNOSIS — J45909 Unspecified asthma, uncomplicated: Secondary | ICD-10-CM | POA: Insufficient documentation

## 2018-10-06 DIAGNOSIS — R509 Fever, unspecified: Secondary | ICD-10-CM | POA: Diagnosis not present

## 2018-10-06 HISTORY — DX: COVID-19: U07.1

## 2018-10-06 LAB — COMPREHENSIVE METABOLIC PANEL
ALT: 21 U/L (ref 0–44)
AST: 24 U/L (ref 15–41)
Albumin: 3.3 g/dL — ABNORMAL LOW (ref 3.5–5.0)
Alkaline Phosphatase: 40 U/L (ref 38–126)
Anion gap: 10 (ref 5–15)
BUN: 11 mg/dL (ref 8–23)
CO2: 24 mmol/L (ref 22–32)
Calcium: 8.3 mg/dL — ABNORMAL LOW (ref 8.9–10.3)
Chloride: 97 mmol/L — ABNORMAL LOW (ref 98–111)
Creatinine, Ser: 0.88 mg/dL (ref 0.61–1.24)
GFR calc Af Amer: >60 mL/min (ref >60)
GFR calc non Af Amer: >60 mL/min (ref >60)
Glucose, Bld: 119 mg/dL — ABNORMAL HIGH (ref 70–99)
Potassium: 3.7 mmol/L (ref 3.5–5.1)
Sodium: 131 mmol/L — ABNORMAL LOW (ref 135–145)
Total Bilirubin: 0.6 mg/dL (ref 0.3–1.2)
Total Protein: 6.5 g/dL (ref 6.5–8.1)

## 2018-10-06 LAB — CBC WITH DIFFERENTIAL/PLATELET
Abs Immature Granulocytes: 0.04 10*3/uL (ref 0.00–0.07)
Basophils Absolute: 0 10*3/uL (ref 0.0–0.1)
Basophils Relative: 0 %
Eosinophils Absolute: 0 10*3/uL (ref 0.0–0.5)
Eosinophils Relative: 0 %
HCT: 43.2 % (ref 39.0–52.0)
Hemoglobin: 14.7 g/dL (ref 13.0–17.0)
Immature Granulocytes: 1 %
Lymphocytes Relative: 13 %
Lymphs Abs: 0.8 10*3/uL (ref 0.7–4.0)
MCH: 31.3 pg (ref 26.0–34.0)
MCHC: 34 g/dL (ref 30.0–36.0)
MCV: 92.1 fL (ref 80.0–100.0)
Monocytes Absolute: 0.9 10*3/uL (ref 0.1–1.0)
Monocytes Relative: 14 %
Neutro Abs: 4.6 10*3/uL (ref 1.7–7.7)
Neutrophils Relative %: 72 %
Platelets: 199 10*3/uL (ref 150–400)
RBC: 4.69 MIL/uL (ref 4.22–5.81)
RDW: 12.7 % (ref 11.5–15.5)
WBC: 6.3 10*3/uL (ref 4.0–10.5)
nRBC: 0 % (ref 0.0–0.2)

## 2018-10-06 LAB — MAGNESIUM: Magnesium: 2.3 mg/dL (ref 1.7–2.4)

## 2018-10-06 LAB — LIPASE, BLOOD: Lipase: 26 U/L (ref 11–51)

## 2018-10-06 MED ORDER — ONDANSETRON HCL 4 MG/2ML IJ SOLN
4.0000 mg | Freq: Once | INTRAMUSCULAR | Status: AC
  Administered 2018-10-06: 4 mg via INTRAVENOUS
  Filled 2018-10-06: qty 2

## 2018-10-06 MED ORDER — SODIUM CHLORIDE 0.9 % IV BOLUS
1000.0000 mL | Freq: Once | INTRAVENOUS | Status: AC
  Administered 2018-10-06: 1000 mL via INTRAVENOUS

## 2018-10-06 MED ORDER — LOPERAMIDE HCL 2 MG PO CAPS: 2.0000 mg | ORAL_CAPSULE | Freq: Four times a day (QID) | ORAL | 0 refills | Status: DC | PRN

## 2018-10-06 MED ORDER — ACETAMINOPHEN 325 MG PO TABS
650.0000 mg | ORAL_TABLET | Freq: Once | ORAL | Status: AC
  Administered 2018-10-06: 13:00:00 650 mg via ORAL
  Filled 2018-10-06: qty 2

## 2018-10-06 MED ORDER — ONDANSETRON 4 MG PO TBDP: 4.0000 mg | ORAL_TABLET | Freq: Three times a day (TID) | ORAL | 0 refills | Status: DC | PRN

## 2018-10-06 MED FILL — ONDANSETRON ODT 4 MG TABLET: 4 | 6 days supply | Qty: 20 | Fill #0

## 2018-10-06 MED FILL — ANTI-DIARRHEAL 2 MG TABS: 2 | 6 days supply | Qty: 24 | Fill #0

## 2018-10-06 NOTE — Discharge Instructions (Signed)
You were seen in the emergency department today with nausea, vomiting, diarrhea.  I am prescribing medications to help with the symptoms.  He need to pay careful attention if you begin to develop shortness of breath, worsening cough, or confusion.  You should return to the emergency department if he is symptoms become severe.  Please continue to drink plenty of fluids.  You should stay out of work, isolated at home, and as much as possible isolated from others in your house.  Call your primary care physician to schedule a follow-up appointment via telehealth.

## 2018-10-06 NOTE — ED Provider Notes (Signed)
Emergency Department Provider Note   I have reviewed the triage vital signs and the nursing notes.   HISTORY  Chief Complaint Covid positive   HPI Leonard Hall is a 63 y.o. male with recent diagnosis of COVID-19 (6 days prior) presents to the emergency department with continued fever, nausea, diarrhea.  Reports some mild abdominal cramping but no focal pain.  States that he feels dehydrated.  He has been having intermittent fevers and treating with over-the-counter pain medications.  He feels generalized weakness and some lightheadedness.  He has been staying home and social distancing from others.  His wife was also diagnosed with COVID-19 but has made a complete recovery.  He denies any chest pain, heart palpitations, shortness of breath.  He has occasional cough.   Past Medical History:  Diagnosis Date  . Asthma   . COVID-19    09/28/2018  . Hypertension     Patient Active Problem List   Diagnosis Date Noted  . Asthma, chronic 10/28/2012  . Cough 09/10/2012  . PNA (pneumonia) 04/24/2012  . Essential hypertension 04/24/2012  . Acute respiratory failure with hypoxia ([place]) 04/24/2012    Past Surgical History:  Procedure Laterality Date  . TOOTH EXTRACTION      Allergies Bee venom, Cashew nut oil, Hornet venom, and Levaquin [levofloxacin in d5w]  Family History  Problem Relation Age of Onset  . Emphysema Maternal Uncle        smoked  . Allergies Brother   . Allergies Mother   . Asthma Father     Social History Social History   Tobacco Use  . Smoking status: Former Smoker    Packs/day: 1.00    Years: 26.00    Pack years: 26.00    Types: Cigarettes    Quit date: 01/05/1997    Years since quitting: 21.7  . Smokeless tobacco: Former Network engineer Use Topics  . Alcohol use: No  . Drug use: No    Review of Systems  Constitutional: No fever/chills. Positive generalized weakness.  Eyes: No visual changes. ENT: No sore throat. Cardiovascular:  Denies chest pain. Respiratory: Denies shortness of breath. Mild dry cough.  Gastrointestinal: Positive occasional abdominal cramping pain. Positive nausea, vomiting, and diarrhea.  No constipation. Genitourinary: Negative for dysuria. Musculoskeletal: Negative for back pain. Skin: Negative for rash. Neurological: Negative for headaches, focal weakness or numbness.  10-point ROS otherwise negative.  ____________________________________________   PHYSICAL EXAM:  VITAL SIGNS: ED Triage Vitals  Enc Vitals Group     BP 10/06/18 1135 140/75     Pulse Rate 10/06/18 1135 78     Resp 10/06/18 1135 12     Temp 10/06/18 1135 98.4 F (36.9 C)     Temp Source 10/06/18 1135 Oral     SpO2 10/06/18 1135 94 %     Weight 10/06/18 1136 210 lb (95.3 kg)   Constitutional: Alert and oriented. Well appearing and in no acute distress. Eyes: Conjunctivae are normal.  Head: Atraumatic. Nose: No congestion/rhinnorhea. Mouth/Throat: Mucous membranes are dry.  Neck: No stridor.   Cardiovascular: Normal rate, regular rhythm. Good peripheral circulation. Grossly normal heart sounds.   Respiratory: Normal respiratory effort.  No retractions. Lungs CTAB. Gastrointestinal: Soft and nontender. No distention.  Musculoskeletal: No lower extremity tenderness nor edema. No gross deformities of extremities. Neurologic:  Normal speech and language.  Skin:  Skin is warm, dry and intact. No rash noted.   ____________________________________________   LABS (all labs ordered are listed, but only  abnormal results are displayed)  Labs Reviewed  COMPREHENSIVE METABOLIC PANEL - Abnormal; Notable for the following components:      Result Value   Sodium 131 (*)    Chloride 97 (*)    Glucose, Bld 119 (*)    Calcium 8.3 (*)    Albumin 3.3 (*)    All other components within normal limits  LIPASE, BLOOD  CBC WITH DIFFERENTIAL/PLATELET  MAGNESIUM   ____________________________________________  RADIOLOGY   None ____________________________________________   PROCEDURES  Procedure(s) performed:   Procedures  None ____________________________________________   INITIAL IMPRESSION / ASSESSMENT AND PLAN / ED COURSE  Pertinent labs & imaging results that were available during my care of the patient were reviewed by me and considered in my medical decision making (see chart for details).   Patient with positive COVID-19 test 6 days ago presents with continued fever, nausea, diarrhea.  He appears moderately dehydrated.  He is not having respiratory symptoms, hypoxemia, increased work of breathing clinically.  He is awake and alert.  Afebrile here.  No hypotension or tachycardia.  Occasional PACs on monitor.  Plan for IV fluids, screening labs, Zofran, and reassess.  I see indication for chest imaging or other respiratory support at this time.  Patient's labs are unremarkable. Feeling slightly better in the ED. No respiratory symptoms of hypoxemia while monitoring here. Given Tylenol for fever. Patient tolerating PO with primarily GI symptoms I do not have an indication for admit at this time. Plan for symptom mgmt and return with any new/worsening symptoms.   [name] [facility] was evaluated in Emergency Department on 10/06/2018 for the symptoms described in the history of present illness. He was evaluated in the context of the global COVID-19 pandemic, which necessitated consideration that the patient might be at risk for infection with the SARS-CoV-2 virus that causes COVID-19. Institutional protocols and algorithms that pertain to the evaluation of patients at risk for COVID-19 are in a state of rapid change based on information released by regulatory bodies including the CDC and federal and state organizations. These policies and algorithms were followed during the patient's care in the ED.  ____________________________________________  FINAL CLINICAL IMPRESSION(S) / ED DIAGNOSES  Final  diagnoses:  COVID-19  Nausea vomiting and diarrhea     MEDICATIONS GIVEN DURING THIS VISIT:  Medications  sodium chloride 0.9 % bolus 1,000 mL ( Intravenous Stopped 10/06/18 1401)  ondansetron (ZOFRAN) injection 4 mg (4 mg Intravenous Given 10/06/18 1211)  acetaminophen (TYLENOL) tablet 650 mg (650 mg Oral Given 10/06/18 1317)     NEW OUTPATIENT MEDICATIONS STARTED DURING THIS VISIT:  Discharge Medication List as of 10/06/2018  2:01 PM    START taking these medications   Details  loperamide (IMODIUM) 2 MG capsule Take 1 capsule (2 mg total) by mouth 4 (four) times daily as needed for diarrhea or loose stools., Starting Thu 10/06/2018, Normal    ondansetron (ZOFRAN ODT) 4 MG disintegrating tablet Take 1 tablet (4 mg total) by mouth every 8 (eight) hours as needed., Starting Thu 10/06/2018, Normal        Note:  This document was prepared using Dragon voice recognition software and may include unintentional dictation errors.  Nanda Quinton, MD, [facility]    , [facility], MD 10/06/18 [phone]

## 2018-10-06 NOTE — ED Notes (Signed)
Pt given gingerale for PO challenge 

## 2018-10-06 NOTE — ED Triage Notes (Signed)
Pt has been sick since Monday 9/21.  Tested positive for COvid on 9/23.  Pt is here due to continued fever, nausea, diarrhea.  Pt reports abdominal discomfort and states that he feels dehydrated.  Pt last took something for fever at 5am. Afebrile on arrival, no sob.  Pt reports generalized weakness and dizziness with this.

## 2018-10-06 NOTE — ED Notes (Signed)
Patient verbalizes understanding of discharge instructions. Opportunity for questioning and answers were provided. Armband removed by staff, pt discharged from ED.  

## 2018-10-06 NOTE — ED Notes (Signed)
Yates Decamp, patient's wife, for update at [id number]. No answer, will attempt to update at a later time.

## 2023-04-28 ENCOUNTER — Ambulatory Visit (INDEPENDENT_AMBULATORY_CARE_PROVIDER_SITE_OTHER): Payer: PRIVATE HEALTH INSURANCE | Admitting: Nurse Practitioner

## 2023-04-28 ENCOUNTER — Encounter (HOSPITAL_BASED_OUTPATIENT_CLINIC_OR_DEPARTMENT_OTHER): Payer: Self-pay | Admitting: Nurse Practitioner

## 2023-04-28 VITALS — BP 130/90 | HR 51 | Ht 68.0 in | Wt 224.4 lb

## 2023-04-28 DIAGNOSIS — R931 Abnormal findings on diagnostic imaging of heart and coronary circulation: Secondary | ICD-10-CM

## 2023-04-28 DIAGNOSIS — R9431 Abnormal electrocardiogram [ECG] [EKG]: Secondary | ICD-10-CM

## 2023-04-28 DIAGNOSIS — E785 Hyperlipidemia, unspecified: Secondary | ICD-10-CM | POA: Diagnosis not present

## 2023-04-28 DIAGNOSIS — I10 Essential (primary) hypertension: Secondary | ICD-10-CM

## 2023-04-28 DIAGNOSIS — I2510 Atherosclerotic heart disease of native coronary artery without angina pectoris: Secondary | ICD-10-CM

## 2023-04-28 NOTE — Patient Instructions (Signed)
 Medication Instructions:   Your physician recommends that you continue on your current medications as directed. Please refer to the Current Medication list given to you today.    *If you need a refill on your cardiac medications before your next appointment, please call your pharmacy*  Lab Work:  TODAY!!!!! NMR/LPA/ALT  If you have labs (blood work) drawn today and your tests are completely normal, you will receive your results only by: MyChart Message (if you have MyChart) OR A paper copy in the mail If you have any lab test that is abnormal or we need to change your treatment, we will call you to review the results.  Testing/Procedures:     Please report to Radiology at the [facility] Main Entrance 30 minutes early for your test.  [address] [place], [place] [address]   How to Prepare for Your Cardiac PET/CT Stress Test:  Nothing to eat or drink, except water, 3 hours prior to arrival time.  NO caffeine/decaffeinated products, or chocolate 12 hours prior to arrival. (Please note decaffeinated beverages (teas/coffees) still contain caffeine).  If you have caffeine within 12 hours prior, the test will need to be rescheduled.  Medication instructions: Do not take erectile dysfunction medications for 72 hours prior to test (sildenafil, tadalafil)  You may take your remaining medications with water.  NO cologne or lotion on chest or abdomen area.  Total time is 1 to 2 hours; you may want to bring reading material for the waiting time.  In preparation for your appointment, medication and supplies will be purchased.  Appointment availability is limited, so if you need to cancel or reschedule, please call the Radiology Department Scheduler at [phone] 24 hours in advance to avoid a cancellation fee of $100.00  What to Expect When you Arrive:  Once you arrive and check in for your appointment, you will be taken to a preparation room within the Radiology  Department.  A technologist or Nurse will obtain your medical history, verify that you are correctly prepped for the exam, and explain the procedure.  Afterwards, an IV will be started in your arm and electrodes will be placed on your skin for EKG monitoring during the stress portion of the exam. Then you will be escorted to the PET/CT scanner.  There, staff will get you positioned on the scanner and obtain a blood pressure and EKG.  During the exam, you will continue to be connected to the EKG and blood pressure machines.  A small, safe amount of a radioactive tracer will be injected in your IV to obtain a series of pictures of your heart along with an injection of a stress agent.    After your Exam:  It is recommended that you eat a meal and drink a caffeinated beverage to counter act any effects of the stress agent.  Drink plenty of fluids for the remainder of the day and urinate frequently for the first couple of hours after the exam.  Your doctor will inform you of your test results within 7-10 business days.  For more information and frequently asked questions, please visit our website: [email or web address]  For questions about your test or how to prepare for your test, please call: Cardiac Imaging Nurse Navigators Office: [phone]   Follow-Up: At [facility], you and your health needs are our priority.  As part of our continuing mission to provide you with exceptional heart care, our providers are all part of one team.  This team includes  your primary Cardiologist (physician) and Advanced Practice Providers or APPs (Physician Assistants and Nurse Practitioners) who all work together to provide you with the care you need, when you need it.  Your next appointment:   3 month(s)  Provider:   Slater Duncan, NP    We recommend signing up for the patient portal called "MyChart".  Sign up information is provided on this After Visit Summary.  MyChart is used to  connect with patients for Virtual Visits (Telemedicine).  Patients are able to view lab/test results, encounter notes, upcoming appointments, etc.  Non-urgent messages can be sent to your provider as well.   To learn more about what you can do with MyChart, go to [email or web address].   Other Instructions Tackling Obesity with Lifestyle Changes  Obesity- What is it? And What can we do about it?  Obesity is a chronic complex disease defined as excessive fat deposits that can have a negative effect on our health. It can lead to many other diseases including type 2 diabetes.  Weight gain occurs when the amount of energy (calories) we consume is greater than the amount we use.  When our energy output is greater than our energy input we lose weight. The basic concept is simple, but in reality, it's much more complicated.  Unfortunately, in some people, our bodies have many ways it can compensate when we try to eat less and move more which can prevent us  from changing our weight. This can lead to some people having a much more difficult time losing weight even when they put healthy habits into practice. This can be frustrating. We want to focus on healthy habits, physical activity and how we feel, and less the number on the scale.  Food As Energy  Calories  Calories is just a unit of measurement for energy.  Counting calories is not required to lose weight but counting for a short period of time can:   help you learn good portion sizes   Learn what your true energy needs are.   Help you be more aware of your snacking or grazing habits  To help calculate how many calories you should be eating, the NIH has a great body weight planner calculator at BeverageBuggy.si  Types of Energy Expenditure  Basal Metabolic Rate (BMR) Energy that our bodies use to preform everyday tasks. More muscle mass through resistance training can increase this a small amount  Thermic Effect of Food The  amount of energy that it takes to breakdown the food we eat. This will be highest when we eat protein and fiber rich foods  Exercise Energy Expenditure The amount of energy used during formal exercise (walking, biking, weightlifting)  Non-exercise activity thermogenesis (NEAT) The amount of energy spent on activities that are not formal exercise (standing, fidgeting). Therefore, it is not only important to do formal exercise but also move around throughout the day.  Managing The Meal  Macro nutrients (carbohydrates, fats and protein, fiber, water)  Micronutrients (vitamins, minerals)  Dietary Fiber  Benefits Examples Cautions  Soluble fiber  Decreases cholesterol  improve blood sugar control,  Feeds our gut bacteria  Allows us  to feel fuller for longer so we eat less  fruits  oats  barley  legumes  peas  Beans  vegetables (broccoli) and root vegetables (carrots) Add fiber into your diet slowly and be sure to drink at least 8 cups of water a day. This will help limit gas, bloating, diarrhea, or constipation.  Insoluble Fiber  Improves digestive  health by making stool easier to pass  Allows us  to feel fuller for longer so we eat less  whole grains  nuts  seeds  skin of fruit  vegetables (green beans, zucchini, cauliflower)  Tricks to add more fiber to your diet   Add beans (pinto, kidney, lima, navy and garbanzo) to salads, ground meat or brown rice   Add nuts or seeds and or fresh/frozen fruit to yogurt, cottage cheese, salads or steel cut oats   Cut up vegetables and eat with hummus   Look for unsweetened whole grain cereals with at least 5g of fiber per serving   Switch to whole grain bread. Look for bread that has whole grain flour as the first ingredient and has more fiber than carbs if you were to multiple the fiber x 10.   Try bulgar, barely, quinoa, buckwheat, brown rice wild rice instead of white rice   Keep frozen vegetables on hand to add to dishes  or soups  Meal Planning:  Meal planning is the key to setting you up for success. Here are some examples of healthy meal options.  Breakfast  Option 1: Omelette with vegetables (1 egg, spinach, mushrooms, or other vegetable of your choice), 2 slices whole-grain toast, tip of thumb size butter or soft margarine,  cup low-fat milk or yogurt  Option 2: steel-cut rolled oats (? cup dry), 1 tbsp peanut butter added to cooked oats,  cup low-fat milk.  Option 3: 2 slices whole-grain or rye toast with avocado spread ( small avocado mased with herbs and pepper to taste), 1 poached egg or [place] up (cooked to your liking)  Option 4:  cup plain 0% [place] yogurt topped with  cup berries and  cup walnuts or almonds, 2 slices whole-grain or rye toast, tip of thumb size soft margarine/butter  Lunch:  Option 1: 2 cups red lentil soup, green salad with 1 tbsp homemade vinaigrette (extra virgin olive oil and vinegar of choice plus spices)  Option 2: 3 oz. roasted chicken, 2 slices whole-grain bread, 2 tsp mayonnaise, mustard, lettuce, tomato if desired, 1 fruit (example: medium-sized apple or small pear)  Option 3: 3 oz. tuna packed in water, 1 whole-wheat pita (6 inch), 2 tsp mayonnaise, lettuce, tomato, or other non-starchy vegetable of your choice, 1 fruit (example: medium-sized apple or small pear)  Option 4: 1 serving of garden veggie buddha bowl with lentils and tahini sauce and 1 cup berries topped with  cup plain 0% Greek yogurt  Dinner:  Option 1: 1 serving roasted cauliflower salad, 3-4 oz. grilled or baked pork loin chop, 1/2 cup mashed potato, or brown rice or quinoa  Option 2: 1 serving fish (baked, grilled or air fried), green salad, 1 tbsp homemade vinaigrette,  cup cooked couscous  Option 3: 1 cup cooked whole grained pasta (example: spaghetti, spirals, macaroni),  cup favorite pasta sauce (preferably homemade), 3-4 oz. grilled or baked chicken, green salad, 1 tbsp homemade  vinaigrette  Option 4: 1 serving oven roasted salmon,  cup mashed sweet potato or couscous or brown rice or quinoa, broccoli (steamed or roasted)  Healthy snacks:   Carrots or celery with 1 tbsp of hummus   1 medium-sized fruit (apple or orange)   1 cup plain 0% [place] yogurt with  cup berries   Half apple, sliced, with 1 tbsp (15 mL) peanut or almond butter  Dining out:  Eating away from home has become a part of many people's lifestyle. Making healthy choices when  you are eating out is important too. Portion size is an important part of healthy choices. Most branded fast-food places provide calories, sodium, and fat content for their menu items. www.calorieking.com would be great resource to find nutrition facts for your favorite brands and fast-food restaurants. Company specific website can be Chief Technology Officer for nutrition information for their items. (e.g. www.mcdonalds.com or www.nutritionix.com/[place]/menu/premium)  Here are some tips to help you make wise food choices when you are dining out.  Chose more often Avoid  Beverages   Choose more often: Water, low fat milk  Sugar-free/diet drinks  Unsweet tea or coffee    Avoid: Milkshakes, fruit drinks, regular pop  Alcohol, specialty drinks (e.g. iced cappuccino)  Fast food  Choose more often:  Garden salad  Mini subs, pita sandwiches ect with extra vegetables  plain burgers, grilled chicken  Vegetarian or cheese pizza with whole-grain crust    Avoid: Burgers/sandwiches with bacon, cheese, and high-fat sauces  [place] fries, fried chicken, fried fish, poutine, hash browns  Pizza with processed meats  Starters   Choose more often: Raw vegetables, salads (garden, spinach, fruit)  clear or vegetable soups  Seafood cocktail  Whole-grain breads and rolls    Avoid: Salads with high-fat dressings or toppings  Creamy soups  Wings, egg rolls  onion rings, nachos  White or garlic bread  Main courses Grains  & Starches (amount equal to  of your plate)  Choose more often:  Oatmeal, high-fiber/lower-sugar cereals  Whole-grain breads, rice, pasta, barley, couscous  Sweet potatoes    Avoid: Sugary, low-fiber cereals  Large bagels, muffins, croissants, white bread  [place] fries, hash browns, fried rice   Meat and alternative (amount equal to  of your plate)  Choose more often:  Lean meats, poultry, fish, eggs, low-fat cheese  Tofu, vegetable protein Legumes (e.g. lentils, chickpeas, beans)    Avoid: High-salt and/or high-fat meats (e.g. ribs, wings, sausages, wieners, processed lunch meats, imposter meats)   Vegetables (amount equal to  of your plate)  Choose more often:  Salads ([place], garden, spinach), plain vegetables   Avoid:  Salads with creamy, high-fat dressings and   Vegetables on sandwiches ect toppings like bacon bits, croutons, cheese  Desserts  Choose more often:  Fresh fruit, frozen yogourt, skim milk latte    Avoid: Cakes, pies, pastries, ice cream, cheesecake   Adopting a Healthy Lifestyle.   Weight: Know what a healthy weight is for you (roughly BMI <25) and aim to maintain this. You can calculate your body mass index on your smart phone. Unfortunately, this is not the most accurate measure of healthy weight, but it is the simplest measurement to use. A more accurate measurement involves body scanning which measures lean muscle, fat tissue and bony density. We do not have this equipment at [facility].    Diet: Aim for 7+ servings of fruits and vegetables daily Limit animal fats in diet for cholesterol and heart health - choose grass fed whenever available Avoid highly processed foods (fast food burgers, tacos, fried chicken, pizza, hot dogs, french fries)  Saturated fat comes in the form of butter, lard, coconut oil, margarine, partially hydrogenated oils, and fat in meat. These increase your risk of cardiovascular disease.  Use healthy plant oils, such as olive,  canola, soy, corn, sunflower and peanut.  Whole foods such as fruits, vegetables and whole grains have fiber  Men need > 38 grams of fiber per day Women need > 25 grams of fiber per day  Load up  on vegetables and fruits - one-half of your plate: Aim for color and variety, and remember that potatoes dont count. Go for whole grains - one-quarter of your plate: Whole wheat, barley, wheat berries, quinoa, oats, brown rice, and foods made with them. If you want pasta, go with whole wheat pasta. Protein power - one-quarter of your plate: Fish, chicken, beans, and nuts are all healthy, versatile protein sources. Limit red meat. You need carbohydrates for energy! The type of carbohydrate is more important than the amount. Choose carbohydrates such as vegetables, fruits, whole grains, beans, and nuts in the place of white rice, white pasta, potatoes (baked or fried), macaroni and cheese, cakes, cookies, and donuts.  If youre thirsty, drink water. Coffee and tea are good in moderation, but skip sugary drinks and limit milk and dairy products to one or two daily servings. Keep sugar intake at 6 teaspoons or 24 grams or LESS       Exercise: Aim for 150 min of moderate intensity exercise weekly for heart health, and weights twice weekly for bone health Stay active - any steps are better than no steps! Aim for 7-9 hours of sleep daily          Mediterranean Diet  Why follow it? Research shows. Those who follow the Mediterranean diet have a reduced risk of heart disease  The diet is associated with a reduced incidence of Parkinson's and Alzheimer's diseases People following the diet may have longer life expectancies and lower rates of chronic diseases  The Dietary Guidelines for Americans recommends the Mediterranean diet as an eating plan to promote health and prevent disease  What Is the Mediterranean Diet?  Healthy eating plan based on typical foods and recipes of Mediterranean-style cooking The  diet is primarily a plant based diet; these foods should make up a majority of meals   Starches - Plant based foods should make up a majority of meals - They are an important sources of vitamins, minerals, energy, antioxidants, and fiber - Choose whole grains, foods high in fiber and minimally processed items  - Typical grain sources include wheat, oats, barley, corn, brown rice, bulgar, farro, millet, polenta, couscous  - Various types of beans include chickpeas, lentils, fava beans, black beans, white beans   Fruits  Veggies - Large quantities of antioxidant rich fruits & veggies; 6 or more servings  - Vegetables can be eaten raw or lightly drizzled with oil and cooked  - Vegetables common to the traditional Mediterranean Diet include: artichokes, arugula, beets, broccoli, brussel sprouts, cabbage, carrots, celery, collard greens, cucumbers, eggplant, kale, leeks, lemons, lettuce, mushrooms, okra, onions, peas, peppers, potatoes, pumpkin, radishes, rutabaga, shallots, spinach, sweet potatoes, turnips, zucchini - Fruits common to the Mediterranean Diet include: apples, apricots, avocados, cherries, clementines, dates, figs, grapefruits, grapes, melons, nectarines, oranges, peaches, pears, pomegranates, strawberries, tangerines  Fats - Replace butter and margarine with healthy oils, such as olive oil, canola oil, and tahini  - Limit nuts to no more than a handful a day  - Nuts include walnuts, almonds, pecans, pistachios, pine nuts  - Limit or avoid candied, honey roasted or heavily salted nuts - Olives are central to the [employer] - can be eaten whole or used in a variety of dishes   Meats Protein - Limiting red meat: no more than a few times a month - When eating red meat: choose lean cuts and keep the portion to the size of deck of cards - Eggs: approx. 0 to 4  times a week  - Fish and lean poultry: at least 2 a week  - Healthy protein sources include, chicken, [place], lean beef,  lamb - Increase intake of seafood such as tuna, salmon, trout, mackerel, shrimp, scallops - Avoid or limit high fat processed meats such as sausage and bacon  Dairy - Include moderate amounts of low fat dairy products  - Focus on healthy dairy such as fat free yogurt, skim milk, low or reduced fat cheese - Limit dairy products higher in fat such as whole or 2% milk, cheese, ice cream  Alcohol - Moderate amounts of red wine is ok  - No more than 5 oz daily for women (all ages) and men older than age 61  - No more than 10 oz of wine daily for men younger than 38  Other - Limit sweets and other desserts  - Use herbs and spices instead of salt to flavor foods  - Herbs and spices common to the traditional Mediterranean Diet include: basil, bay leaves, chives, cloves, cumin, fennel, garlic, lavender, marjoram, mint, oregano, parsley, pepper, rosemary, sage, savory, sumac, tarragon, thyme   It's not just a diet, it's a lifestyle:  The Mediterranean diet includes lifestyle factors typical of those in the region  Foods, drinks and meals are best eaten with others and savored Daily physical activity is important for overall good health This could be strenuous exercise like running and aerobics This could also be more leisurely activities such as walking, housework, yard-work, or taking the stairs Moderation is the key; a balanced and healthy diet accommodates most foods and drinks Consider portion sizes and frequency of consumption of certain foods   Meal Ideas & Options:  Breakfast:  Whole wheat toast or whole wheat English muffins with peanut butter & hard boiled egg Steel cut oats topped with apples & cinnamon and skim milk  Fresh fruit: banana, strawberries, melon, berries, peaches  Smoothies: strawberries, bananas, greek yogurt, peanut butter Low fat greek yogurt with blueberries and granola  Egg white omelet with spinach and mushrooms Breakfast couscous: whole wheat couscous, apricots, skim  milk, cranberries  Sandwiches:  Hummus and grilled vegetables (peppers, zucchini, squash) on whole wheat bread   Grilled chicken on whole wheat pita with lettuce, tomatoes, cucumbers or tzatziki  [place] salad on whole wheat bread: tuna salad made with greek yogurt, olives, red peppers, capers, green onions Garlic rosemary lamb pita: lamb sauted with garlic, rosemary, salt & pepper; add lettuce, cucumber, greek yogurt to pita - flavor with lemon juice and black pepper  Seafood:  Mediterranean grilled salmon, seasoned with garlic, basil, parsley, lemon juice and black pepper Shrimp, lemon, and spinach whole-grain pasta salad made with low fat greek yogurt  Seared scallops with lemon orzo  Seared tuna steaks seasoned salt, pepper, coriander topped with tomato mixture of olives, tomatoes, olive oil, minced garlic, parsley, green onions and cappers  Meats:  Herbed greek chicken salad with kalamata olives, cucumber, feta  Red bell peppers stuffed with spinach, bulgur, lean ground beef (or lentils) & topped with feta   Kebabs: skewers of chicken, tomatoes, onions, zucchini, squash  [place] burgers: made with red onions, mint, dill, lemon juice, feta cheese topped with roasted red peppers Vegetarian Cucumber salad: cucumbers, artichoke hearts, celery, red onion, feta cheese, tossed in olive oil & lemon juice  Hummus and whole grain pita points with a greek salad (lettuce, tomato, feta, olives, cucumbers, red onion) Lentil soup with celery, carrots made with vegetable broth, garlic, salt and  pepper  Tabouli salad: parsley, bulgur, mint, scallions, cucumbers, tomato, radishes, lemon juice, olive oil, salt and pepper.   Goals: 1: Aim to get 150 minutes of moderate intensity exercise each week 2: Decrease intake of saturated fat and eat a more balanced diet high in vegetables, lean protein, fruit and whole grains

## 2023-04-28 NOTE — Progress Notes (Signed)
 Cardiology Office Note:  .   Date:  04/28/2023 ID:  Leonard Hall, DOB 1955-11-26, MRN [id number] PCP: Lonzell Robin, MD [facility] Providers Cardiologist:  None   Patient Profile: .      PMH Coronary artery disease CT Calcium score 02/11/2023 CAC score 1107 (90th percentile) LM 0, LAD 181, LCx 3, RCA/PDA 923 Hyperlipidemia Hypertension Hypothyroidism Severe asthma       History of Present Illness: .    History of Present Illness Leonard Hall is a very pleasant 68 y.o. male  who is here today for new patient consult for elevated coronary artery calcium score. He is accompanied by his wife. 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 He does yard work which involves push mowing on the weekends. He denies chest pain, SOB, palpitations, orthopnea, PND, presyncope or syncope. Admits he is very tired after work and does not feel like exercising on a regular basis. He has a history of high blood pressure and asthma and was referred for cardiac CT scan by PCP. Reports history of asthma which was exacerbated while taking a different antihypertensive (likely lisinopril ) and frequent exacerbation which would make him feel "like I was dying."  He was advised by PCP to start atorvastatin February 2025 after the CT.  He has been trying to lose weight by primarily eating a meat high protein diet with high intake of boiled eggs, hamburgers, and hot dogs.    Family history: His family history includes Allergies in his brother and mother; Asthma in his father; Emphysema in his maternal uncle.   Discussed the use of AI scribe software for clinical note transcription with the patient, who gave verbal consent to proceed.  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, sport and exercise work on weekends  No results found for: "LIPOA"   ROS: See HPI       Studies Reviewed: Aaron Aas   EKG Interpretation Date/Time:  Wednesday April 28 2023 10:58:33 EDT Ventricular Rate:  51 PR Interval:  164 QRS Duration:  100 QT Interval:  410 QTC Calculation: 377 R Axis:   -24  Text Interpretation: Sinus bradycardia Inferior infarct , age undetermined When compared with ECG of 24-Apr-2012 17:43, Inferior infarct is now Present Confirmed by Slater Duncan [phone]) on 04/28/2023 11:06:29 AM      Risk Assessment/Calculations:     HYPERTENSION CONTROL Vitals:   04/28/23 1054 04/28/23 1638  BP: (!) 130/90 (!) 130/90    The patient's blood pressure is elevated above target today.  In order to address the patient's elevated BP: Blood pressure will be monitored at home to determine if medication changes need to be made.          Physical Exam:   VS: BP (!) 130/90  Pulse (!) 51   Ht 5\' 8"  (1.727 m)   Wt 224 lb 6.4 oz (101.8 kg)   SpO2 97%   BMI 34.12 kg/m   Wt Readings from Last 3 Encounters:  04/28/23 224 lb 6.4 oz (101.8 kg)  10/06/18 210 lb (95.3 kg)  12/15/13 234 lb (106.1 kg)     GEN: Well nourished, well developed in no acute distress NECK: No JVD; No carotid bruits CARDIAC: RRR, no murmurs, rubs, gallops RESPIRATORY:  Clear to auscultation without rales, wheezing or rhonchi  ABDOMEN: Soft, non-tender, non-distended EXTREMITIES:  No edema; No deformity     ASSESSMENT AND PLAN: .    Assessment & Plan Coronary artery disease with calcification CT calcium score 02/11/2023 revealed significantly elevated calcium score of 1107 (90th  percentile) with bulk of calcification in RCA/PDA and some in LAD.  EKG today reveals sinus bradycardia at 51 bpm, inferior infarct age undetermined.  We discussed the significance and that there is no recent EKG for comparison. He is active at work and with push mowing and other yard work. He denies chest pain, dyspnea, or other symptoms concerning for angina, however with significant calcification > 300 and additional risk factors of hyperlipidemia, hypertension, ASCVD risk score is 18.2%.  We will get PET/CT for evaluation of ischemia. Encouraged him to avoid high fat diet and focus on secondary prevention including heart healthy mostly plant based diet avoiding saturated fat, processed foods, simple carbohydrates, and sugar along with aiming for at least 150 minutes of moderate intensity exercise each week.    Hypertension   BP is elevated and remains elevated on my recheck. Reports BP is generally well-controlled. Encouraged him to monitor home BP for goal of < 140/80. Renal function well controlled. Continue telmisartan .   Hyperlipidemia LDL goal < 70 Lipid panel 02/04/84 with total cholesterol 243, triglycerides 65, HDL 58, and LDL-C 169.  He started atorvastatin 20 mg at that time.  We will recheck NMR lipid profile along with ALT and lipoprotein a for further risk stratification. Continue atorvastatin. Recommended heart healthy diet avoiding processed foods, saturated fat, sugar, and simple carbohydrates.  Abnormal EKG EKG today reveals sinus bradycardia at 51 bpm, inferior infarct age undetermined.  He does not have a recent EKG for comparison. I explained this finding in detail.  We are getting PET/CT for evaluation of ischemia.   Plan/Goals: 1: Aim to get 150 minutes of moderate intensity exercise each week 2: Decrease intake of saturated fat and eat a more balanced diet high in vegetables, lean protein, fruit and whole grains       Informed Consent   Shared Decision Making/Informed  Consent The risks [chest pain, shortness of breath, cardiac arrhythmias, dizziness, blood pressure fluctuations, myocardial infarction, stroke/transient ischemic attack, nausea, vomiting, allergic reaction, radiation exposure, metallic taste sensation and life-threatening complications (estimated to be 1 in 10,000)], benefits (risk stratification, diagnosing coronary artery disease, treatment guidance) and alternatives of a cardiac PET stress test were discussed in detail with Mr. Taranto and he agrees to proceed.     Disposition: 3 months with me  Signed, Slater Duncan, NP-C

## 2023-04-29 LAB — NMR, LIPOPROFILE
Cholesterol, Total: 157 mg/dL (ref 100–199)
HDL Particle Number: 36.3 umol/L (ref >=30.5)
HDL-C: 63 mg/dL (ref >39)
LDL Particle Number: 913 nmol/L (ref <1000)
LDL Size: 21.2 nm (ref >20.5)
LDL-C (NIH Calc): 85 mg/dL (ref 0–99)
LP-IR Score: 26 (ref <=45)
Small LDL Particle Number: 394 nmol/L (ref <=527)
Triglycerides: 41 mg/dL (ref 0–149)

## 2023-04-29 LAB — LIPOPROTEIN A (LPA): Lipoprotein (a): 22.8 nmol/L (ref <75.0)

## 2023-04-29 LAB — ALT: ALT: 18 IU/L (ref 0–44)

## 2023-04-30 ENCOUNTER — Encounter (HOSPITAL_BASED_OUTPATIENT_CLINIC_OR_DEPARTMENT_OTHER): Payer: Self-pay

## 2023-04-30 DIAGNOSIS — E785 Hyperlipidemia, unspecified: Secondary | ICD-10-CM

## 2023-05-03 ENCOUNTER — Other Ambulatory Visit: Payer: Self-pay

## 2023-05-03 ENCOUNTER — Other Ambulatory Visit (HOSPITAL_BASED_OUTPATIENT_CLINIC_OR_DEPARTMENT_OTHER): Payer: Self-pay

## 2023-05-03 MED ORDER — EZETIMIBE 10 MG PO TABS
10.0000 mg | ORAL_TABLET | Freq: Every day | ORAL | 3 refills | Status: DC
Start: 2023-05-03 — End: 2023-05-03
  Filled 2023-05-03: qty 90, 90d supply, fill #0

## 2023-05-03 MED ORDER — EZETIMIBE 10 MG PO TABS: 10.0000 mg | ORAL_TABLET | Freq: Every day | ORAL | 3 refills | Status: AC

## 2023-05-03 NOTE — Addendum Note (Signed)
 Addended by: Guss Legacy on: 05/03/2023 03:24 PM   Modules accepted: Orders

## 2023-07-28 ENCOUNTER — Encounter (HOSPITAL_COMMUNITY): Payer: Self-pay

## 2023-08-02 ENCOUNTER — Encounter (HOSPITAL_COMMUNITY): Payer: Self-pay | Admitting: *Deleted

## 2023-08-02 ENCOUNTER — Ambulatory Visit (HOSPITAL_BASED_OUTPATIENT_CLINIC_OR_DEPARTMENT_OTHER): Payer: PRIVATE HEALTH INSURANCE | Admitting: Nurse Practitioner

## 2023-08-02 ENCOUNTER — Telehealth (HOSPITAL_COMMUNITY): Payer: Self-pay | Admitting: *Deleted

## 2023-08-02 NOTE — Telephone Encounter (Signed)
 Left message on voicemail per DPR in reference to upcoming appointment scheduled on  08/04/23 with detailed instructions given per Myocardial Perfusion Study Information Sheet for the test. LM to arrive 15 minutes early, and that it is imperative to arrive on time for appointment to keep from having the test rescheduled. If you need to cancel or reschedule your appointment, please call the office within 24 hours of your appointment. Failure to do so may result in a cancellation of your appointment, and a $50 no show fee. Phone number given for call back for any questions. Claudene Ronal Quale

## 2023-08-03 ENCOUNTER — Other Ambulatory Visit (HOSPITAL_BASED_OUTPATIENT_CLINIC_OR_DEPARTMENT_OTHER): Payer: Self-pay | Admitting: Nurse Practitioner

## 2023-08-03 ENCOUNTER — Ambulatory Visit (HOSPITAL_COMMUNITY): Admission: RE | Admit: 2023-08-03 | Payer: PRIVATE HEALTH INSURANCE | Source: Ambulatory Visit

## 2023-08-03 DIAGNOSIS — R931 Abnormal findings on diagnostic imaging of heart and coronary circulation: Secondary | ICD-10-CM

## 2023-08-03 DIAGNOSIS — E785 Hyperlipidemia, unspecified: Secondary | ICD-10-CM

## 2023-08-03 DIAGNOSIS — I2510 Atherosclerotic heart disease of native coronary artery without angina pectoris: Secondary | ICD-10-CM

## 2023-08-04 ENCOUNTER — Ambulatory Visit (HOSPITAL_COMMUNITY)
Admission: RE | Admit: 2023-08-04 | Payer: PRIVATE HEALTH INSURANCE | Source: Ambulatory Visit | Attending: Nurse Practitioner | Admitting: Nurse Practitioner

## 2023-08-06 ENCOUNTER — Ambulatory Visit (HOSPITAL_COMMUNITY)
Admission: RE | Admit: 2023-08-06 | Discharge: 2023-08-06 | Disposition: A | Discharge status: Home / Self Care | Payer: PRIVATE HEALTH INSURANCE | Source: Ambulatory Visit | Attending: Cardiology | Admitting: Cardiology

## 2023-08-06 DIAGNOSIS — E785 Hyperlipidemia, unspecified: Secondary | ICD-10-CM | POA: Insufficient documentation

## 2023-08-06 DIAGNOSIS — I2510 Atherosclerotic heart disease of native coronary artery without angina pectoris: Secondary | ICD-10-CM | POA: Diagnosis present

## 2023-08-06 DIAGNOSIS — R931 Abnormal findings on diagnostic imaging of heart and coronary circulation: Secondary | ICD-10-CM | POA: Insufficient documentation

## 2023-08-06 LAB — MYOCARDIAL PERFUSION IMAGING
LV dias vol: 102 mL (ref 62–150)
LV sys vol: 40 mL (ref 4.2–5.8)
Nuc Stress EF: 61 %
Peak HR: 84 {beats}/min
Rest HR: 64 {beats}/min
Rest Nuclear Isotope Dose: 10.8 mCi
SDS: 0
SRS: 0
SSS: 0
ST Depression (mm): 0 mm
Stress Nuclear Isotope Dose: 32.5 mCi
TID: 1.08

## 2023-08-06 MED ORDER — TECHNETIUM TC 99M TETROFOSMIN IV KIT
10.8000 | PACK | Freq: Once | INTRAVENOUS | Status: AC | PRN
  Administered 2023-08-06: 10.8 via INTRAVENOUS

## 2023-08-06 MED ORDER — TECHNETIUM TC 99M TETROFOSMIN IV KIT
32.5000 | PACK | Freq: Once | INTRAVENOUS | Status: AC | PRN
  Administered 2023-08-06: 32.5 via INTRAVENOUS

## 2023-08-06 MED ORDER — REGADENOSON 0.4 MG/5ML IV SOLN
INTRAVENOUS | Status: AC
  Filled 2023-08-06: qty 5

## 2023-08-06 MED ORDER — REGADENOSON 0.4 MG/5ML IV SOLN
0.4000 mg | Freq: Once | INTRAVENOUS | Status: AC
Start: 2023-08-06 — End: 2023-08-06
  Administered 2023-08-06: 0.4 mg via INTRAVENOUS

## 2023-08-08 ENCOUNTER — Ambulatory Visit: Payer: Self-pay | Admitting: Nurse Practitioner

## 2023-08-08 NOTE — Progress Notes (Signed)
 " Cardiology Office Note:  .   Date:  08/09/2023 ID:  Leonard Hall, DOB August 25, 1955, MRN [id number] PCP: Xenia Devonshire, MD [facility] Providers Cardiologist:  None   Patient Profile: .      PMH Coronary artery disease CT Calcium score 02/11/2023 CAC score 1107 (90th percentile) LM 0, LAD 181, LCx 3, RCA/PDA 923 Low risk lexiscan  myoview  08/06/2023 Aortic atherosclerosis Hyperlipidemia Hypertension Hypothyroidism Severe asthma  Referred to cardiology and seen by me on 04/28/23 for new patient consult for elevated coronary artery calcium score. He is accompanied by his wife. 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 He does yard work which involves push mowing on the weekends. He denied chest pain, SOB, palpitations, orthopnea, PND, presyncope or syncope. Admits very tired after work and does not feel like exercising on a regular basis. History of high blood pressure and asthma and was referred for cardiac CT scan by PCP. History of asthma which was exacerbated while taking a different antihypertensive (likely lisinopril ) and frequent exacerbation which would make him feel like I was dying.  He was advised by PCP to start atorvastatin February 2025 after the CT.  He has been trying to lose weight by primarily eating a meat high protein diet with high intake of boiled eggs, hamburgers, and hot dogs. No family history significant for early CAD. ASCVD risk score 16%. LP(a) was tested and is not elevated. Lipid panel completed on that day: LDL particle number 913, total cholesterol 157, LDL-C 85, HDL-C 63, triglycerides 41, small LDL-P 394. He was advised to add Zetia  10 mg in addition to atorvastatin 20 mg daily.   Ischemia evaluation pursued given significantly high CA score. Lexiscan  myoview  completed 08/06/23 was low risk with no evidence of infarction and normal heart function.        History of Present  Illness: .    History of Present Illness Leonard Hall is a very pleasant 68 y.o. male  who is here today with his wife for follow-up of coronary artery disease. We reviewed the results of his lexiscan  myoview  in detail. He continues to feel well with no chest pain, shortness or breath, palpitations, orthopnea, or PND. He started walking for exercise and subsequently developed sciatica which has limited his physical activity for nine weeks. He received two injections for relief but has not started physical therapy.  His diet has been inconsistent, with a tendency towards unhealthy eating habits, and he has gained weight during his inactivity. He aims to improve his diet by incorporating more, lean protein, vegetables and fruits and reducing snacks. There is no significant family history of heart attacks or strokes. 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   Lipoprotein (a)  Date/Time Value Ref Range Status  04/28/2023 11:47 AM 22.8 <75.0 nmol/L Final    Comment:    Note:  Values greater than or equal to 75.0 nmol/L may        indicate an independent risk factor for CHD,        but must be evaluated with caution when applied        to non-Caucasian populations due to the        influence of genetic factors on Lp(a) across        ethnicities.      ROS: See HPI  Studies Reviewed: .          Risk Assessment/Calculations:             Physical Exam:   VS: BP 134/70   Pulse 67   Ht 5' 9 (1.753 m)   Wt 228 lb (103.4 kg)   SpO2 96%   BMI 33.67 kg/m   Wt Readings from Last 3 Encounters:  08/09/23 228 lb (103.4 kg)  08/06/23 224 lb (101.6 kg)  04/28/23 224 lb 6.4 oz (101.8 kg)     GEN: Well nourished, well developed in no acute distress NECK: No JVD; No carotid bruits CARDIAC: RRR, no murmurs, rubs, gallops RESPIRATORY:  Clear to auscultation without rales, wheezing or rhonchi  ABDOMEN: Soft, non-tender,  non-distended EXTREMITIES:  No edema; No deformity     ASSESSMENT AND PLAN: .    Assessment & Plan Coronary artery disease with calcification CT calcium score 02/11/2023 revealed significantly elevated calcium score of 1107 (90th percentile) with bulk of calcification in RCA/PDA and some in LAD. EKG on 04/28/2023 revealed sinus bradycardia at 51 bpm, inferior infarct age undetermined.  We discussed the significance and that there is no recent EKG for comparison. Plan to proceed with cardiac PET/CT was pursued, however due to problems with getting the test in a timely manner, he underwent Lexiscan  Myoview  on 08/06/23 which was low risk and revealed normal heart function. He denies chest pain, dyspnea, or other symptoms concerning for angina.  No indication for further ischemic evaluation at this time He has not been as active recently due to sciatic nerve pain. He plans to return to regular physical activity and may pursue PT.  -Gradually increase physical activity as tolerated -Aim for at least 150 minutes of moderate exercise each week  -Focus on heart healthy, whole food diet avoiding saturated fat, processed foods, simple carbohydrates, and sugar     Hypertension   BP is well-controlled.  He has not been monitoring consistently at home.  No change in antihypertensive therapy.  -Continue telmisartan .   Hyperlipidemia LDL goal < 70 Lipid panel 5/76/7974 with LDL particle #913, LDL-C 85, HDL-C 63, triglycerides 41, total cholesterol 157, and small LDL-P 394.  He reported compliance with atorvastatin 20 mg daily.  We added Zetia  10 mg daily. Lipoprotein a is not elevated.  -Recheck fasting lipid panel today to ensure improvement on lipid therapy -Heart healthy diet avoiding processed foods, saturated fat, sugar, and simple carbohydrates along with regular physical activity and moderate intensity exercise -Continue atorvastatin and ezetimibe          Disposition: 1 year with me  Signed, Rosaline Bane, NP-C "

## 2023-08-09 ENCOUNTER — Telehealth: Payer: Self-pay | Admitting: Nurse Practitioner

## 2023-08-09 ENCOUNTER — Ambulatory Visit (HOSPITAL_BASED_OUTPATIENT_CLINIC_OR_DEPARTMENT_OTHER): Payer: PRIVATE HEALTH INSURANCE | Admitting: Nurse Practitioner

## 2023-08-09 ENCOUNTER — Encounter (HOSPITAL_BASED_OUTPATIENT_CLINIC_OR_DEPARTMENT_OTHER): Payer: Self-pay | Admitting: Nurse Practitioner

## 2023-08-09 VITALS — BP 134/70 | HR 67 | Ht 69.0 in | Wt 228.0 lb

## 2023-08-09 DIAGNOSIS — I10 Essential (primary) hypertension: Secondary | ICD-10-CM | POA: Diagnosis not present

## 2023-08-09 DIAGNOSIS — I2510 Atherosclerotic heart disease of native coronary artery without angina pectoris: Secondary | ICD-10-CM | POA: Diagnosis not present

## 2023-08-09 DIAGNOSIS — R931 Abnormal findings on diagnostic imaging of heart and coronary circulation: Secondary | ICD-10-CM | POA: Diagnosis not present

## 2023-08-09 DIAGNOSIS — E785 Hyperlipidemia, unspecified: Secondary | ICD-10-CM | POA: Diagnosis not present

## 2023-08-09 NOTE — Patient Instructions (Signed)
 Medication Instructions:  Your physician recommends that you continue on your current medications as directed. Please refer to the Current Medication list given to you today.  *If you need a refill on your cardiac medications before your next appointment, please call your pharmacy*  Lab Work: TODAY - ALT and NMR lipid profile (cholesterol testing) If you have labs (blood work) drawn today and your tests are completely normal, you will receive your results only by: MyChart Message (if you have MyChart) OR A paper copy in the mail If you have any lab test that is abnormal or we need to change your treatment, we will call you to review the results.  Testing/Procedures: None Ordered  Follow-Up: At [facility], you and your health needs are our priority.  As part of our continuing mission to provide you with exceptional heart care, our providers are all part of one team.  This team includes your primary Cardiologist (physician) and Advanced Practice Providers or APPs (Physician Assistants and Nurse Practitioners) who all work together to provide you with the care you need, when you need it.  Your next appointment:   1 year(s)  Provider:   Rosaline Bane, NP    We recommend signing up for the patient portal called MyChart.  Sign up information is provided on this After Visit Summary.  MyChart is used to connect with patients for Virtual Visits (Telemedicine).  Patients are able to view lab/test results, encounter notes, upcoming appointments, etc.  Non-urgent messages can be sent to your provider as well.   To learn more about what you can do with MyChart, go to [email or web address].   Other Instructions Adopting a Healthy Lifestyle.   Weight: Know what a healthy weight is for you (roughly BMI <25) and aim to maintain this. You can calculate your body mass index on your smart phone. Unfortunately, this is not the most accurate measure of healthy weight, but it is the simplest  measurement to use. A more accurate measurement involves body scanning which measures lean muscle, fat tissue and bony density. We do not have this equipment at [facility].    Diet: Aim for 7+ servings of fruits and vegetables daily Limit animal fats in diet for cholesterol and heart health - choose grass fed whenever available Avoid highly processed foods (fast food burgers, tacos, fried chicken, pizza, hot dogs, french fries)  Saturated fat comes in the form of butter, lard, coconut oil, margarine, partially hydrogenated oils, and fat in meat. These increase your risk of cardiovascular disease.  Use healthy plant oils, such as olive, canola, soy, corn, sunflower and peanut.  Whole foods such as fruits, vegetables and whole grains have fiber  Men need > 38 grams of fiber per day Women need > 25 grams of fiber per day  Load up on vegetables and fruits - one-half of your plate: Aim for color and variety, and remember that potatoes dont count. Go for whole grains - one-quarter of your plate: Whole wheat, barley, wheat berries, quinoa, oats, brown rice, and foods made with them. If you want pasta, go with whole wheat pasta. Protein power - one-quarter of your plate: Fish, chicken, beans, and nuts are all healthy, versatile protein sources. Limit red meat. You need carbohydrates for energy! The type of carbohydrate is more important than the amount. Choose carbohydrates such as vegetables, fruits, whole grains, beans, and nuts in the place of white rice, white pasta, potatoes (baked or fried), macaroni and cheese, cakes, cookies, and donuts.  If  youre thirsty, drink water. Coffee and tea are good in moderation, but skip sugary drinks and limit milk and dairy products to one or two daily servings. Keep sugar intake at 6 teaspoons or 24 grams or LESS       Exercise: Aim for 150 min of moderate intensity exercise weekly for heart health, and weights twice weekly for bone health Stay active - any steps  are better than no steps! Aim for 7-9 hours of sleep daily   Tackling Obesity with Lifestyle Changes  Obesity- What is it? And What can we do about it?  Obesity is a chronic complex disease defined as excessive fat deposits that can have a negative effect on our health. It can lead to many other diseases including type 2 diabetes.  Weight gain occurs when the amount of energy (calories) we consume is greater than the amount we use.  When our energy output is greater than our energy input we lose weight. The basic concept is simple, but in reality, it's much more complicated.  Unfortunately, in some people, our bodies have many ways it can compensate when we try to eat less and move more which can prevent us  from changing our weight. This can lead to some people having a much more difficult time losing weight even when they put healthy habits into practice. This can be frustrating. We want to focus on healthy habits, physical activity and how we feel, and less the number on the scale.  Food As Energy  Calories  Calories is just a unit of measurement for energy.  Counting calories is not required to lose weight but counting for a short period of time can:   help you learn good portion sizes   Learn what your true energy needs are.   Help you be more aware of your snacking or grazing habits  To help calculate how many calories you should be eating, the NIH has a great body weight planner calculator at BeverageBuggy.si  Types of Energy Expenditure  Basal Metabolic Rate (BMR) Energy that our bodies use to preform everyday tasks. More muscle mass through resistance training can increase this a small amount  Thermic Effect of Food The amount of energy that it takes to breakdown the food we eat. This will be highest when we eat protein and fiber rich foods  Exercise Energy Expenditure The amount of energy used during formal exercise (walking, biking,  weightlifting)  Non-exercise activity thermogenesis (NEAT) The amount of energy spent on activities that are not formal exercise (standing, fidgeting). Therefore, it is not only important to do formal exercise but also move around throughout the day.  Managing The Meal  Macro nutrients (carbohydrates, fats and protein, fiber, water)  Micronutrients (vitamins, minerals)  Dietary Fiber  Benefits Examples Cautions  Soluble fiber  Decreases cholesterol  improve blood sugar control,  Feeds our gut bacteria  Allows us  to feel fuller for longer so we eat less  fruits  oats  barley  legumes  peas  Beans  vegetables (broccoli) and root vegetables (carrots) Add fiber into your diet slowly and be sure to drink at least 8 cups of water a day. This will help limit gas, bloating, diarrhea, or constipation.  Insoluble Fiber  Improves digestive health by making stool easier to pass  Allows us  to feel fuller for longer so we eat less  whole grains  nuts  seeds  skin of fruit  vegetables (green beans, zucchini, cauliflower)  Tricks to add more fiber  to your diet   Add beans (pinto, kidney, lima, navy and garbanzo) to salads, ground meat or brown rice   Add nuts or seeds and or fresh/frozen fruit to yogurt, cottage cheese, salads or steel cut oats   Cut up vegetables and eat with hummus   Look for unsweetened whole grain cereals with at least 5g of fiber per serving   Switch to whole grain bread. Look for bread that has whole grain flour as the first ingredient and has more fiber than carbs if you were to multiple the fiber x 10.   Try bulgar, barely, quinoa, buckwheat, brown rice wild rice instead of white rice   Keep frozen vegetables on hand to add to dishes or soups  Meal Planning:  Meal planning is the key to setting you up for success. Here are some examples of healthy meal options.  Breakfast  Option 1: Omelette with vegetables (1 egg, spinach, mushrooms, or other  vegetable of your choice), 2 slices whole-grain toast, tip of thumb size butter or soft margarine,  cup low-fat milk or yogurt  Option 2: steel-cut rolled oats (? cup dry), 1 tbsp peanut butter added to cooked oats,  cup low-fat milk.  Option 3: 2 slices whole-grain or rye toast with avocado spread ( small avocado mased with herbs and pepper to taste), 1 poached egg or [place] up (cooked to your liking)  Option 4:  cup plain 0% [place] yogurt topped with  cup berries and  cup walnuts or almonds, 2 slices whole-grain or rye toast, tip of thumb size soft margarine/butter  Lunch:  Option 1: 2 cups red lentil soup, green salad with 1 tbsp homemade vinaigrette (extra virgin olive oil and vinegar of choice plus spices)  Option 2: 3 oz. roasted chicken, 2 slices whole-grain bread, 2 tsp mayonnaise, mustard, lettuce, tomato if desired, 1 fruit (example: medium-sized apple or small pear)  Option 3: 3 oz. tuna packed in water, 1 whole-wheat pita (6 inch), 2 tsp mayonnaise, lettuce, tomato, or other non-starchy vegetable of your choice, 1 fruit (example: medium-sized apple or small pear)  Option 4: 1 serving of garden veggie buddha bowl with lentils and tahini sauce and 1 cup berries topped with  cup plain 0% Greek yogurt  Dinner:  Option 1: 1 serving roasted cauliflower salad, 3-4 oz. grilled or baked pork loin chop, 1/2 cup mashed potato, or brown rice or quinoa  Option 2: 1 serving fish (baked, grilled or air fried), green salad, 1 tbsp homemade vinaigrette,  cup cooked couscous  Option 3: 1 cup cooked whole grained pasta (example: spaghetti, spirals, macaroni),  cup favorite pasta sauce (preferably homemade), 3-4 oz. grilled or baked chicken, green salad, 1 tbsp homemade vinaigrette  Option 4: 1 serving oven roasted salmon,  cup mashed sweet potato or couscous or brown rice or quinoa, broccoli (steamed or roasted)  Healthy snacks:   Carrots or celery with 1 tbsp of hummus   1  medium-sized fruit (apple or orange)   1 cup plain 0% [place] yogurt with  cup berries   Half apple, sliced, with 1 tbsp (15 mL) peanut or almond butter  Dining out:  Eating away from home has become a part of many people's lifestyle. Making healthy choices when you are eating out is important too. Portion size is an important part of healthy choices. Most branded fast-food places provide calories, sodium, and fat content for their menu items. www.calorieking.com would be great resource to find nutrition facts for your favorite  brands and fast-food restaurants. Company specific website can be Chief Technology Officer for nutrition information for their items. (e.g. www.mcdonalds.com or www.nutritionix.com/[place]/menu/premium)  Here are some tips to help you make wise food choices when you are dining out.  Chose more often Avoid  Beverages   Choose more often: Water, low fat milk  Sugar-free/diet drinks  Unsweet tea or coffee    Avoid: Milkshakes, fruit drinks, regular pop  Alcohol, specialty drinks (e.g. iced cappuccino)  Fast food  Choose more often:  Garden salad  Mini subs, pita sandwiches ect with extra vegetables  plain burgers, grilled chicken  Vegetarian or cheese pizza with whole-grain crust    Avoid: Burgers/sandwiches with bacon, cheese, and high-fat sauces  [place] fries, fried chicken, fried fish, poutine, hash browns  Pizza with processed meats  Starters   Choose more often: Raw vegetables, salads (garden, spinach, fruit)  clear or vegetable soups  Seafood cocktail  Whole-grain breads and rolls    Avoid: Salads with high-fat dressings or toppings  Creamy soups  Wings, egg rolls  onion rings, nachos  White or garlic bread  Main courses Grains & Starches (amount equal to  of your plate)  Choose more often:  Oatmeal, high-fiber/lower-sugar cereals  Whole-grain breads, rice, pasta, barley, couscous  Sweet potatoes    Avoid: Sugary, low-fiber cereals   Large bagels, muffins, croissants, white bread  [place] fries, hash browns, fried rice   Meat and alternative (amount equal to  of your plate)  Choose more often:  Lean meats, poultry, fish, eggs, low-fat cheese  Tofu, vegetable protein Legumes (e.g. lentils, chickpeas, beans)    Avoid: High-salt and/or high-fat meats (e.g. ribs, wings, sausages, wieners, processed lunch meats, imposter meats)   Vegetables (amount equal to  of your plate)  Choose more often:  Salads ([place], garden, spinach), plain vegetables   Avoid:  Salads with creamy, high-fat dressings and   Vegetables on sandwiches ect toppings like bacon bits, croutons, cheese  Desserts  Choose more often:  Fresh fruit, frozen yogourt, skim milk latte    Avoid: Cakes, pies, pastries, ice cream, cheesecake

## 2023-08-10 LAB — ALT: ALT: 38 IU/L (ref 0–44)

## 2023-08-10 NOTE — Telephone Encounter (Signed)
 Error

## 2023-08-13 ENCOUNTER — Ambulatory Visit: Payer: Self-pay | Admitting: Nurse Practitioner

## 2023-08-13 DIAGNOSIS — I2510 Atherosclerotic heart disease of native coronary artery without angina pectoris: Secondary | ICD-10-CM

## 2023-08-13 DIAGNOSIS — E785 Hyperlipidemia, unspecified: Secondary | ICD-10-CM

## 2023-08-13 DIAGNOSIS — R931 Abnormal findings on diagnostic imaging of heart and coronary circulation: Secondary | ICD-10-CM

## 2023-09-29 ENCOUNTER — Encounter (HOSPITAL_BASED_OUTPATIENT_CLINIC_OR_DEPARTMENT_OTHER): Payer: Self-pay | Admitting: *Deleted

## 2023-10-11 NOTE — Progress Notes (Signed)
 " Physical Therapy Progress Note  Patient Name:  Leonard Hall [facility] Date of Birth:  1955/10/17 Today's Date:  October 11, 2023 Referred by:  Waylon Rockey SAUNDERS, MD  Visit #: 5 of   20v/max Certification Period: 08/31/23 to 11/29/23  Date for PT Re-Evaluation: 09/30/23  Precautions:      Assessment/Plan   Assessment & Plan   Assessment  Assessment details: PHYSICAL THERAPY DX: Right lumbar radiculopathy  Ellaree Hutchinson Sax presents to PT for improving R lumbar radicular pain. Pt responded fairly to previous session so continued w/ similar interventions today. Right hip flex/abd causes R thigh cramping. He has improved tolerance of R hip flexion with area of pain has decreased to pencil size area. He has centralization of lumbar symptoms and improvement. He would like to continue with HEP and call PRN.  The patient's current status relative to goals: progress is being made toward all goals. Any goals not currently met are due to need to continue with POC. Although the patient has demonstrated improvements due to the interventions and therapy provided, they have yet to attain maximal improvement. It is anticipated that improvement will occur within 4 weeks. Perceived quality of life: Good. Chart will remain open x30days, he will call PRN if needed.  Physical therapy diagnosis, signs and symptoms are consistent with the following functional deficits and limitations: abnormal or restricted ROM, activity intolerance, Impaired physical strength, lacks appropriate home exercise program, pain with function, poor posture, and Impaired ADL performance.  The patient requires continued skilled Physical Therapy services through the use of the therapeutic interventions listed below in order to address the identified impairments. Upon system's review, no comorbid conditions were identified to contraindicate therapeutic interventions.   Is it anticipated that the patient will require more extensive  therapy services, potentially over a longer period of time, than typical for the condition being treated? No: It is expected that the condition will measurably improve in a reasonable (and relatively predictable) period of time.      Plan  Plan details:  Continual Care Information Had a surgical procedure (post-op) or acute stroke related to conditions for which services are being requested? No  Nature of Current Condition: Initial Onset (within last 3 months) Cause of Current Episode: Unspecified Objective Assessment of Current Functional Ability (based on chosen outcome measure): Moderate Functional Limitations   Plan(s) for next session Continue to progress as able. and lumbar extension preference, hip flexor stretch bil, once centralized >1wk add dural glides in supine/neutral spine, core stability and body mechanics edu  Therapy options:  Frequency: 2x/week Duration of Time: 4 weeks  Planned Interventions:  Planned Interventions: Home Exercise Program, Manual Therapy IASTM, Joint Mobilizations, Myofascial Release, STM, and Trigger Point Release , Neuromuscular Re-education, Performance Testing, Self Care/Home Management/Education, Therapeutic Activities, and Therapeutic Exercises    Patient/caregiver reported no cultural and/or religious practices that should be considered in patient's treatment plan. No psychosocial barriers to treatment have been identified at this time. No stresses, losses or important life events within the last twelve months were identified at this time. Patient/caregiver were informed and in agreement with treatment plan risks and benefits.     Subjective/History   Subjective Evaluation    History of Present Illness   Subjective/Mechanism of injury: Pt states that his back had soreness in legs x2 days after last session. He notes no current radicular pain  Pain Current Pain Rating: 1-2/10 Location: L lumbar and L latera hip Quality: knife-like  and burning  Prior Imaging/Diagnostic  Tests:  MRI- lumbar--  At L3-4, the disc protrusion extends into the right neural foramen.There is multilevel degenerative changes contributing to multilevel thecal sac compression and neural foraminal narrowing as described above.   Objective  There were no vitals filed for this visit.  Objective Lumbosacral ROM Anything left blank was deferred at this time  []  ROM WFL in all planes        []  Strength WFL Grossly in all planes Motion (Normal) AROM PROM Comments   Right Left Right Left   Flexion (0-60o) To shins, NE    Extension (0-35o) WFL    Rotation (0-45 o)       Lateral Flexion (0-25 o) Jt line Jt line     Side Glides        Lower Extremity ROM & Strength: Anything left blank was deferred at this time  []  ROM WFL in all planes        []  Strength WFL Grossly in all planes  Motion (Normal) AROM PROM Strength Comments   Right Left Right Left Right Left           Hip:                Flexion (0-120o)  pull in HL        4+ 5    Extension (0-20o)                Abduction (0-45o)                Adduction (0-30o)                ER (0-50o)                IR (0-45o)                       Knee:                Flexion (0-130o)         5   5    Extension (0o)          5 5            Ankle:                Dorsiflexion (0-20o)          5 5     Plantarflexion (0-45o)                Inversion (0-20o)                Eversion (0-15o)                       Great Toe:                Extension (0-20o)                 Repeated Movements Anything left blank was deferred at this time Test Symptoms DURING testing Symptoms AFTER testing Comments  Repeated Flex in Standing (RFIS)     Repeated Ext in Standing (REIS)     Repeated Flex in Sitting     Repeated Flex in Lying (RFIL) SKTC inc    Repeated Ext in Lying (REIL) NE -AROM challenged    Side glide in Standing - R     Side glide in Standing - L     Side glide in Lying - R     Side  glide in Lying  - L     Static Tests     KEY: P = produce; A = abolish; Inc = increase; Dec = decrease; NE = no effect; C'ing = centralizing; Per = peripheralizing; B = better; W = worse; C'ed = centralized; Per'd = peripheralized    Gait/Mobility: Decreased RLE WB, rounded shld   Special Tests: R SLR positive; L neg.;  noted bil HS tightness   Patient Specific Functional Score (PSFS)  The PSFS is a reliable and valid outcome measurement tool which allows clinicians to determine activity limitations and functional ability. The PSFS can be used over a large number of patients with varying degrees of independence and diagnoses. ACT 1- WALKING ACT 2- GETTING IN/OUT OF VEHICLE  Activity 1 Score: 10 Activity 2 Score: 8 PSFS Score: 9 PSFS Score change between initial evaluation and last documented measurement: 2     Today's Treatment/Intervention   Treatments Performed:    Interventions Performed    Anything left blank was deferred at this time During all sensitive area treatments, patient provided continual verbal consent before and during each intervention.     Therapeutic Ex [phone]):  Goals, psfs, MMT, gait assessed L shld on wall , R lumbar side glides10x3s Chest on wall lumbar ext 10x3s Prone position x1' POE x2' Prone press up 5 sec holds x10 Prone quad stretch w/ band x30 Prone glut sets Prone TrA bracing 3 sec holds x10 Prone hip ext 2x10 Supine dural glides in supine/neutral spine w/ towel, RLE 10x3s   HELD: Bridge 2x10 HL clamshell blueBOSU 2x10 Supine hamstring + ITB stretch w/ band x30 b/l Supine piriformis stretch 2x30  Therapeutic Act (02469):    Manual (02859):    Neuro Re-Ed (02887):  HELD: Manual lumbar traction w/ peanut ball PNF hold-relax to b/l quads in prone x3 cycles   Gait Training (02883):    Aquatic Therapy [phone]):  Modalities:  No modalities used at this time  Self-Care/Home Management [phone]):  The patient was educated regarding: therapy  diagnosis, anatomy and physiology, treatment plan, rationale, therapeutic goals, expectations, home exercise program, positioning (pillow placement), and posture/ergonomics. All patient questions were entertained and answered today. The education provided was required by a skilled therapist through active lecturing, demonstration and/or provision of literature in order to optimize independence with self-care and home management (including but not limited to ADL training, compensatory training, safety and use of adaptive equipment).  Patient verbalized and demonstrated understanding/agreement with education provided.   HEP Provided:  Access Code: [email or web address] URL: [email or web address] Date: 10/05/2023 Prepared by: Belvie Satterfield Tibbetts  Exercises - Lying Prone  - 1 x daily - 7 x weekly - 3 sets - 10 reps - 3s hold - Prone Press Up  - 1 x daily - 7 x weekly - 3 sets - 10 reps - 3s hold - Standing Lumbar Extension at Wall - Forearms  - 1 x daily - 7 x weekly - 2 sets - 10 reps - 3 hold - Seated Correct Posture  - 1 x daily - 7 x weekly - 1 sets - 1 reps - Prone Quadriceps Stretch with Strap  - 1 x daily - 7 x weekly - 3 sets - 10 reps - Prone Transversus Abdominus Contraction  - 1 x daily - 7 x weekly - 3 sets - 10 reps - Prone Hip Extension  - 1 x daily - 7 x weekly - 3 sets - 10 reps  Skilled PT required for exercise performance  to ensure proper technique for continued safe & effective progression.  Each CPT code is separate and distinct therefore medically necessary.   Goals & Plan of Care  Start Date: August 31, 2023 End date: 11/29/23 Patient Goal: decrease pain  Pt will be independent with HEP to improve their ability to self-manage symptoms and reduce risk of recurrence. IN PROGRESS Pt will increase right hip flexion strength to 5/5 to improve their ability to perform functional mobility/ADLs. MET Pt will report 5/10 or less pain with functional activity and less occurrence  of sleep disturbance d/t pain. IN PROGRESS Pt will improve PSFS average score from 7 at initial evaluation by >2 in order to demonstrate functional improvement. IN PROGRESS Pt will demonstrate good understanding of posture principles and proper body mechanics with lifting and other functional activities to reduce postural strain.IN PROGRESS  Treatment Time   Today's Evaluation/Treatment: 1702  1745 Total Time: 43 min   Charges   Total Time Code Treatment Minutes: 43  PT Therapeutic Time Entry Therapeutic Exercise Time Entry: 40 (3)  Randine Agent, PT 10/11/2023 5:10 PM  [facility] [address] [place] [address] Dept: [phone] Dept Fax: [phone]       "

## 2024-01-07 ENCOUNTER — Emergency Department (HOSPITAL_BASED_OUTPATIENT_CLINIC_OR_DEPARTMENT_OTHER)
Admission: EM | Admit: 2024-01-07 | Discharge: 2024-01-08 | Disposition: A | Discharge status: Home / Self Care | Source: Home / Self Care | Attending: Emergency Medicine | Admitting: Emergency Medicine

## 2024-01-07 ENCOUNTER — Encounter (HOSPITAL_BASED_OUTPATIENT_CLINIC_OR_DEPARTMENT_OTHER): Payer: Self-pay

## 2024-01-07 ENCOUNTER — Emergency Department (HOSPITAL_BASED_OUTPATIENT_CLINIC_OR_DEPARTMENT_OTHER)

## 2024-01-07 ENCOUNTER — Other Ambulatory Visit: Payer: Self-pay

## 2024-01-07 DIAGNOSIS — R109 Unspecified abdominal pain: Secondary | ICD-10-CM | POA: Diagnosis present

## 2024-01-07 DIAGNOSIS — R112 Nausea with vomiting, unspecified: Secondary | ICD-10-CM | POA: Insufficient documentation

## 2024-01-07 LAB — CBC
HCT: 44 % (ref 39.0–52.0)
Hemoglobin: 15.4 g/dL (ref 13.0–17.0)
MCH: 31.4 pg (ref 26.0–34.0)
MCHC: 35 g/dL (ref 30.0–36.0)
MCV: 89.6 fL (ref 80.0–100.0)
Platelets: 239 K/uL (ref 150–400)
RBC: 4.91 MIL/uL (ref 4.22–5.81)
RDW: 11.9 % (ref 11.5–15.5)
WBC: 8.4 K/uL (ref 4.0–10.5)
nRBC: 0 % (ref 0.0–0.2)

## 2024-01-07 LAB — URINALYSIS, ROUTINE W REFLEX MICROSCOPIC
Bilirubin Urine: NEGATIVE
Glucose, UA: NEGATIVE mg/dL
Ketones, ur: 15 mg/dL — AB
Leukocytes,Ua: NEGATIVE
Nitrite: NEGATIVE
Protein, ur: NEGATIVE mg/dL
Specific Gravity, Urine: 1.025 (ref 1.005–1.030)
pH: 5.5 (ref 5.0–8.0)

## 2024-01-07 LAB — COMPREHENSIVE METABOLIC PANEL WITH GFR
ALT: 29 U/L (ref 0–44)
AST: 26 U/L (ref 15–41)
Albumin: 4.6 g/dL (ref 3.5–5.0)
Alkaline Phosphatase: 61 U/L (ref 38–126)
Anion gap: 13 (ref 5–15)
BUN: 13 mg/dL (ref 8–23)
CO2: 23 mmol/L (ref 22–32)
Calcium: 9.7 mg/dL (ref 8.9–10.3)
Chloride: 101 mmol/L (ref 98–111)
Creatinine, Ser: 0.88 mg/dL (ref 0.61–1.24)
GFR, Estimated: >60 mL/min
Glucose, Bld: 96 mg/dL (ref 70–99)
Potassium: 4.7 mmol/L (ref 3.5–5.1)
Sodium: 137 mmol/L (ref 135–145)
Total Bilirubin: 0.6 mg/dL (ref 0.0–1.2)
Total Protein: 7.1 g/dL (ref 6.5–8.1)

## 2024-01-07 LAB — URINALYSIS, MICROSCOPIC (REFLEX): WBC, UA: NONE SEEN WBC/hpf (ref 0–5)

## 2024-01-07 LAB — LIPASE, BLOOD: Lipase: 41 U/L (ref 11–51)

## 2024-01-07 MED ORDER — IOHEXOL 300 MG/ML  SOLN
100.0000 mL | Freq: Once | INTRAMUSCULAR | Status: AC | PRN
  Administered 2024-01-07: 100 mL via INTRAVENOUS

## 2024-01-07 NOTE — ED Triage Notes (Signed)
 Pt arrives with c/o ABD pain that started this morning. Pt denies n/v/d.

## 2024-01-07 NOTE — ED Provider Notes (Signed)
 " [facility] Provider Note   CSN: [id number] Arrival date & time: 01/07/24  1802     History Chief Complaint  Patient presents with   Abdominal Pain    HPI Leonard Hall is a 69 y.o. male presenting for crampy abdominal pain since 10am this morning near his naval. Diffuse radiation. No history of similar. His umbilical hernia swelled up 3x its normal size this morning  Patient's recorded medical, surgical, social, medication list and allergies were reviewed in the Snapshot window as part of the initial history.   Review of Systems   Review of Systems  Constitutional:  Negative for chills and fever.  HENT:  Negative for ear pain and sore throat.   Eyes:  Negative for pain and visual disturbance.  Respiratory:  Negative for cough and shortness of breath.   Cardiovascular:  Negative for chest pain and palpitations.  Gastrointestinal:  Positive for abdominal pain. Negative for nausea and vomiting.  Genitourinary:  Negative for dysuria and hematuria.  Musculoskeletal:  Negative for arthralgias and back pain.  Skin:  Negative for color change and rash.  Neurological:  Negative for seizures and syncope.  All other systems reviewed and are negative.   Physical Exam Updated Vital Signs BP (!) 150/69   Pulse (!) 56   Temp 97.6 F (36.4 C) (Oral)   Resp 20   Wt 99.8 kg   SpO2 93%   BMI 32.49 kg/m  Physical Exam Vitals and nursing note reviewed.  Constitutional:      General: He is not in acute distress.    Appearance: He is well-developed.  HENT:     Head: Normocephalic and atraumatic.  Eyes:     Conjunctiva/sclera: Conjunctivae normal.  Cardiovascular:     Rate and Rhythm: Normal rate and regular rhythm.     Heart sounds: No murmur heard. Pulmonary:     Effort: Pulmonary effort is normal. No respiratory distress.     Breath sounds: Normal breath sounds.  Abdominal:     Palpations: Abdomen is soft.     Tenderness:  There is abdominal tenderness.  Musculoskeletal:        General: No swelling.     Cervical back: Neck supple.  Skin:    General: Skin is warm and dry.     Capillary Refill: Capillary refill takes less than 2 seconds.  Neurological:     Mental Status: He is alert.  Psychiatric:        Mood and Affect: Mood normal.      ED Course/ Medical Decision Making/ A&P    Procedures Procedures   Medications Ordered in ED Medications  iohexol  (OMNIPAQUE ) 300 MG/ML solution 100 mL (100 mLs Intravenous Contrast Given 01/07/24 2130)  morphine  (PF) 4 MG/ML injection 6 mg (6 mg Intravenous Given 01/08/24 0028)  ketorolac  (TORADOL ) 15 MG/ML injection 30 mg (30 mg Intravenous Given 01/08/24 0027)  acetaminophen  (TYLENOL ) tablet 1,000 mg (1,000 mg Oral Given 01/08/24 0028)   Medical Decision Making:   Leonard Hall is a 69 y.o. male who presented to the ED today with abdominal pain, detailed above.    Additional history discussed with patient's family/caregivers.  Patient placed on continuous vitals and telemetry monitoring while in ED which was reviewed periodically.  Complete initial physical exam performed, notably the patient  was very tender around his swollen umbilicus but otherwise benign exam.     Reviewed and confirmed nursing documentation for past medical history, family history,  social history.    Initial Assessment:   With the patient's presentation of abdominal pain, most likely diagnosis is musculoskeletal etiology/nonspecific etiology. Other diagnoses were considered including (but not limited to) gastroenteritis, colitis, small bowel obstruction, appendicitis, cholecystitis, pancreatitis, nephrolithiasis, UTI, pyleonephritis. These are considered less likely due to history of present illness and physical exam findings.   This is most consistent with an acute life/limb threatening illness complicated by underlying chronic conditions.   Initial Plan:  CBC/CMP to evaluate for underlying  infectious/metabolic etiology for patient's abdominal pain  Lipase to evaluate for pancreatitis  EKG to evaluate for cardiac source of pain  CTAB/Pelvis with contrast to evaluate for structural/surgical etiology of patients' severe abdominal pain.  Urinalysis and repeat physical assessment to evaluate for UTI/Pyelonpehritis  Empiric management of symptoms with escalating pain control and antiemetics as needed.   Initial Study Results:   Laboratory  All laboratory results reviewed without evidence of clinically relevant pathology.      EKG EKG was reviewed independently. Rate, rhythm, axis, intervals all examined and without medically relevant abnormality. ST segments without concerns for elevations.    Radiology All images reviewed independently. Agree with radiology report at this time.   CT ABDOMEN PELVIS W CONTRAST Result Date: 01/07/2024 EXAM: CT ABDOMEN AND PELVIS WITH CONTRAST 01/07/2024 09:20:00 PM TECHNIQUE: CT of the abdomen and pelvis was performed with the administration of 100 mL of iohexol  (OMNIPAQUE ) 300 MG/ML solution. Multiplanar reformatted images are provided for review. Automated exposure control, iterative reconstruction, and/or weight-based adjustment of the mA/kV was utilized to reduce the radiation dose to as low as reasonably achievable. COMPARISON: Cardiac CT chest 08/06/2023. CLINICAL HISTORY: Abdominal pain, acute, nonlocalized. Abdominal pain starting this morning. FINDINGS: LOWER CHEST: Patchy peribronchial infiltrates in the lung bases, possibly bronchopneumonia or aspiration. LIVER: Multiple circumscribed cysts throughout the liver, unchanged since prior study, likely simple cyst. No imaging follow-up is indicated. GALLBLADDER AND BILE DUCTS: Gallbladder is unremarkable. No biliary ductal dilatation. SPLEEN: No acute abnormality. PANCREAS: No acute abnormality. ADRENAL GLANDS: No acute abnormality. KIDNEYS, URETERS AND BLADDER: No stones in the kidneys or ureters. No  hydronephrosis. No perinephric or periureteral stranding. Urinary bladder is unremarkable. GI AND BOWEL: Stomach demonstrates no acute abnormality. The stomach, small bowel, and colon are not abnormally distended. No wall thickening or inflammatory stranding. The appendix is not identified. There is no bowel obstruction. PERITONEUM AND RETROPERITONEUM: No ascites. No free air. There is a moderate periumbilical hernia containing fat and fluid. No bowel herniation. VASCULATURE: Aorta is normal in caliber. Calcification of the aorta without aneurysm. LYMPH NODES: No lymphadenopathy. REPRODUCTIVE ORGANS: The prostate gland is unremarkable. BONES AND SOFT TISSUES: No acute osseous abnormality. No focal soft tissue abnormality. IMPRESSION: 1. No acute findings in the abdomen or pelvis. 2. Moderate periumbilical hernia containing fat and fluid, without bowel herniation. Possible fat necrosis. 3. Infiltrates in the lung bases. Electronically signed by: Elsie Gravely MD 01/07/2024 10:24 PM EST RP Workstation: [id number]     Final Reassessment and Plan:   This is a 69 year old male with a fat hernia with necrosis.  He is very tender but otherwise in no acute distress.  Medications assisted with his tolerance of discomfort.  Recommend he follow-up with general surgery for operative repair in the outpatient setting with supportive care medications in the interim.  Discussed that he develops intractable nausea and vomiting then bowel may become involved and that he would need to return but currently there is no evidence of bowel obstruction.  Patient  feels better with medications.  Patient and family feel okay with outpatient care management.      Clinical Impression:  1. Abdominal pain, unspecified abdominal location   2. Nausea and vomiting, unspecified vomiting type      Discharge   Final Clinical Impression(s) / ED Diagnoses Final diagnoses:  Abdominal pain, unspecified abdominal location  Nausea and  vomiting, unspecified vomiting type    Rx / DC Orders ED Discharge Orders          Ordered    oxyCODONE  (ROXICODONE ) 5 MG immediate release tablet  Every 6 hours PRN        01/08/24 0228    acetaminophen  (TYLENOL ) 500 MG tablet  Every 8 hours PRN        01/08/24 0228              Jerral Meth, MD 01/08/24 [id number]  "

## 2024-01-08 MED ORDER — OXYCODONE HCL 5 MG PO TABS: 5.0000 mg | ORAL_TABLET | Freq: Four times a day (QID) | ORAL | 0 refills | Status: AC | PRN

## 2024-01-08 MED ORDER — KETOROLAC TROMETHAMINE 15 MG/ML IJ SOLN
30.0000 mg | Freq: Once | INTRAMUSCULAR | Status: AC
  Administered 2024-01-08: 30 mg via INTRAVENOUS
  Filled 2024-01-08: qty 2

## 2024-01-08 MED ORDER — ACETAMINOPHEN 500 MG PO TABS
1000.0000 mg | ORAL_TABLET | Freq: Once | ORAL | Status: AC
  Administered 2024-01-08: 1000 mg via ORAL
  Filled 2024-01-08: qty 2

## 2024-01-08 MED ORDER — MORPHINE SULFATE (PF) 4 MG/ML IV SOLN
6.0000 mg | Freq: Once | INTRAVENOUS | Status: AC
  Administered 2024-01-08: 6 mg via INTRAVENOUS
  Filled 2024-01-08: qty 2

## 2024-01-08 MED ORDER — ACETAMINOPHEN 500 MG PO TABS: 1000.0000 mg | ORAL_TABLET | Freq: Three times a day (TID) | ORAL | 0 refills | Status: AC | PRN
